# Patient Record
Sex: Female | Born: 1940 | Race: White | Hispanic: No | Marital: Married | Smoking: Never smoker
Health system: Southern US, Community
[De-identification: ages and names within clinical notes are randomized; demographics above are authoritative.]

## PROBLEM LIST (undated history)

## (undated) DIAGNOSIS — T4145XA Adverse effect of unspecified anesthetic, initial encounter: Secondary | ICD-10-CM

## (undated) DIAGNOSIS — I739 Peripheral vascular disease, unspecified: Secondary | ICD-10-CM

## (undated) DIAGNOSIS — Z9889 Other specified postprocedural states: Secondary | ICD-10-CM

## (undated) DIAGNOSIS — F419 Anxiety disorder, unspecified: Secondary | ICD-10-CM

## (undated) DIAGNOSIS — R2689 Other abnormalities of gait and mobility: Secondary | ICD-10-CM

## (undated) DIAGNOSIS — E785 Hyperlipidemia, unspecified: Secondary | ICD-10-CM

## (undated) DIAGNOSIS — K219 Gastro-esophageal reflux disease without esophagitis: Secondary | ICD-10-CM

## (undated) DIAGNOSIS — F32A Depression, unspecified: Secondary | ICD-10-CM

## (undated) DIAGNOSIS — T8859XA Other complications of anesthesia, initial encounter: Secondary | ICD-10-CM

## (undated) DIAGNOSIS — A809 Acute poliomyelitis, unspecified: Secondary | ICD-10-CM

## (undated) DIAGNOSIS — M1990 Unspecified osteoarthritis, unspecified site: Secondary | ICD-10-CM

## (undated) DIAGNOSIS — R112 Nausea with vomiting, unspecified: Secondary | ICD-10-CM

## (undated) DIAGNOSIS — R51 Headache: Secondary | ICD-10-CM

## (undated) DIAGNOSIS — F329 Major depressive disorder, single episode, unspecified: Secondary | ICD-10-CM

## (undated) DIAGNOSIS — R519 Headache, unspecified: Secondary | ICD-10-CM

## (undated) HISTORY — PX: OOPHORECTOMY: SHX86

## (undated) HISTORY — PX: EYE SURGERY: SHX253

## (undated) HISTORY — PX: TONSILLECTOMY: SUR1361

## (undated) HISTORY — DX: Acute poliomyelitis, unspecified: A80.9

## (undated) HISTORY — PX: REDUCTION MAMMAPLASTY: SUR839

## (undated) HISTORY — DX: Unspecified osteoarthritis, unspecified site: M19.90

## (undated) HISTORY — DX: Major depressive disorder, single episode, unspecified: F32.9

## (undated) HISTORY — DX: Anxiety disorder, unspecified: F41.9

## (undated) HISTORY — DX: Depression, unspecified: F32.A

## (undated) HISTORY — DX: Hyperlipidemia, unspecified: E78.5

## (undated) HISTORY — DX: Gastro-esophageal reflux disease without esophagitis: K21.9

## (undated) HISTORY — PX: BREAST CYST EXCISION: SHX579

## (undated) HISTORY — PX: BREAST SURGERY: SHX581

## (undated) HISTORY — PX: BREAST BIOPSY: SHX20

## (undated) HISTORY — DX: Other abnormalities of gait and mobility: R26.89

## (undated) HISTORY — PX: OTHER SURGICAL HISTORY: SHX169

---

## 1995-04-09 HISTORY — PX: OTHER SURGICAL HISTORY: SHX169

## 2004-04-08 HISTORY — PX: REPLACEMENT TOTAL KNEE: SUR1224

## 2012-09-11 ENCOUNTER — Encounter: Payer: Self-pay | Admitting: Gastroenterology

## 2012-10-14 ENCOUNTER — Encounter: Payer: Self-pay | Admitting: Gastroenterology

## 2012-10-14 ENCOUNTER — Ambulatory Visit (INDEPENDENT_AMBULATORY_CARE_PROVIDER_SITE_OTHER): Payer: Medicare HMO | Admitting: Gastroenterology

## 2012-10-14 VITALS — BP 136/82 | HR 80 | Ht 64.0 in | Wt 182.4 lb

## 2012-10-14 DIAGNOSIS — Z1211 Encounter for screening for malignant neoplasm of colon: Secondary | ICD-10-CM

## 2012-10-14 MED ORDER — NA SULFATE-K SULFATE-MG SULF 17.5-3.13-1.6 GM/177ML PO SOLN: 1.0000 | Freq: Once | ORAL | Status: DC

## 2012-10-14 NOTE — Progress Notes (Signed)
History of Present Illness:  Pleasant 72 year old white female referred for screening colonoscopy. Last examined 2003 apparently was unremarkable. She has no GI complaints including change of bowel habits, abdominal pain, melena or hematochezia.    Past Medical History  Diagnosis Date  . Arthritis   . Depression   . Hyperlipemia    Past Surgical History  Procedure Laterality Date  . Oophorectomy     family history includes Ovarian cancer in her mother. Current Outpatient Prescriptions  Medication Sig Dispense Refill  . omeprazole (PRILOSEC) 20 MG capsule Take 20 mg by mouth 2 (two) times daily.      . pravastatin (PRAVACHOL) 10 MG tablet Take 10 mg by mouth daily. Patient unsure of dosage       No current facility-administered medications for this visit.   Allergies as of 10/14/2012  . (No Known Allergies)    reports that she has never smoked. She has never used smokeless tobacco. She reports that  drinks alcohol. She reports that she does not use illicit drugs.     Review of Systems: Pertinent positive and negative review of systems were noted in the above HPI section. All other review of systems were otherwise negative.  Vital signs were reviewed in today's medical record Physical Exam: General: Well developed , well nourished, no acute distress Skin: anicteric Head: Normocephalic and atraumatic Eyes:  sclerae anicteric, EOMI Ears: Normal auditory acuity Mouth: No deformity or lesions Neck: Supple, no masses or thyromegaly Lungs: Clear throughout to auscultation Heart: Regular rate and rhythm; no murmurs, rubs or bruits Abdomen: Soft, non tender and non distended. No masses, hepatosplenomegaly or hernias noted. Normal Bowel sounds Rectal:deferred Musculoskeletal: Symmetrical with no gross deformities  Skin: No lesions on visible extremities Pulses:  Normal pulses noted Extremities: No clubbing, cyanosis, edema or deformities noted Neurological: Alert oriented x 4,  grossly nonfocal Cervical Nodes:  No significant cervical adenopathy Inguinal Nodes: No significant inguinal adenopathy Psychological:  Alert and cooperative. Normal mood and affect

## 2012-10-14 NOTE — Patient Instructions (Addendum)

## 2012-10-14 NOTE — Assessment & Plan Note (Signed)
Plan screening colonoscopy  Risks, alternatives, and complications of the procedure, including bleeding, perforation, and possible need for surgery, were explained to the patient.  Patient's questions were answered.  

## 2012-12-23 ENCOUNTER — Encounter: Payer: Medicare HMO | Admitting: Gastroenterology

## 2013-01-04 ENCOUNTER — Encounter: Payer: Self-pay | Admitting: Gastroenterology

## 2013-01-04 ENCOUNTER — Ambulatory Visit (AMBULATORY_SURGERY_CENTER): Payer: Medicare HMO | Admitting: Gastroenterology

## 2013-01-04 VITALS — BP 118/70 | HR 67 | Temp 97.2°F | Resp 17 | Ht 64.0 in | Wt 182.0 lb

## 2013-01-04 DIAGNOSIS — K5730 Diverticulosis of large intestine without perforation or abscess without bleeding: Secondary | ICD-10-CM

## 2013-01-04 DIAGNOSIS — Z1211 Encounter for screening for malignant neoplasm of colon: Secondary | ICD-10-CM

## 2013-01-04 DIAGNOSIS — K648 Other hemorrhoids: Secondary | ICD-10-CM

## 2013-01-04 MED ORDER — SODIUM CHLORIDE 0.9 % IV SOLN: 500.0000 mL | INTRAVENOUS | Status: DC

## 2013-01-04 NOTE — Op Note (Signed)
[facility] [address] [place], [address]   COLONOSCOPY PROCEDURE REPORT  PATIENT: Joanne, Edwards  MR#: [id number] BIRTHDATE: 02-16-1941 , 72  yrs. old GENDER: Female ENDOSCOPIST: Louis Meckel, MD REFERRED PP:IRJJOA Sun, M.D. PROCEDURE DATE:  01/04/2013 PROCEDURE:   Colonoscopy, diagnostic First Screening Colonoscopy - Avg.  risk and is 50 yrs.  old or older - No.  Prior Negative Screening - Now for repeat screening. 10 or more years since last screening  History of Adenoma - Now for follow-up colonoscopy & has been > or = to 3 yrs.  N/A  Polyps Removed Today? No.  Recommend repeat exam, <10 yrs? No. ASA CLASS:   Class II INDICATIONS:Colorectal cancer screening. MEDICATIONS: MAC sedation, administered by CRNA and propofol (Diprivan) 250mg  IV  DESCRIPTION OF PROCEDURE:   After the risks benefits and alternatives of the procedure were thoroughly explained, informed consent was obtained.  A digital rectal exam revealed no abnormalities of the rectum.   The LB [id number]  endoscope was introduced through the anus and advanced to the cecum, which was identified by both the appendix and ileocecal valve. No adverse events experienced.   The quality of the prep was excellent using Suprep  The instrument was then slowly withdrawn as the colon was fully examined.      COLON FINDINGS: Mild diverticulosis was noted in the sigmoid colon. Internal hemorrhoids were found.   The colon mucosa was otherwise normal.  Retroflexed views revealed no abnormalities. The time to cecum=2 minutes 30 seconds.  Withdrawal time=7 minutes 36 seconds. The scope was withdrawn and the procedure completed. COMPLICATIONS: There were no complications.  ENDOSCOPIC IMPRESSION: 1.   Mild diverticulosis was noted in the sigmoid colon 2.   Internal hemorrhoids 3.   The colon mucosa was otherwise normal  RECOMMENDATIONS: Continue current colorectal screening recommendations for  "routine risk" patients with a repeat colonoscopy in 10 years.   eSigned:  Louis Meckel, MD 01/04/2013 9:51 AM   cc:   PATIENT NAME:  Joanne, Edwards MR#: [id number]

## 2013-01-04 NOTE — Progress Notes (Signed)
Patient did not experience any of the following events: a burn prior to discharge; a fall within the facility; wrong site/side/patient/procedure/implant event; or a hospital transfer or hospital admission upon discharge from the facility. (G8907) Patient did not have preoperative order for IV antibiotic SSI prophylaxis. (G8918)  

## 2013-01-04 NOTE — Patient Instructions (Addendum)
YOU HAD AN ENDOSCOPIC PROCEDURE TODAY AT THE [facility]: Refer to the procedure report that was given to you for any specific questions about what was found during the examination.  If the procedure report does not answer your questions, please call your gastroenterologist to clarify.  If you requested that your care partner not be given the details of your procedure findings, then the procedure report has been included in a sealed envelope for you to review at your convenience later.  YOU SHOULD EXPECT: Some feelings of bloating in the abdomen. Passage of more gas than usual.  Walking can help get rid of the air that was put into your GI tract during the procedure and reduce the bloating. If you had a lower endoscopy (such as a colonoscopy or flexible sigmoidoscopy) you may notice spotting of blood in your stool or on the toilet paper. If you underwent a bowel prep for your procedure, then you may not have a normal bowel movement for a few days.  DIET: Your first meal following the procedure should be a light meal and then it is ok to progress to your normal diet.  A half-sandwich or bowl of soup is an example of a good first meal.  Heavy or fried foods are harder to digest and may make you feel nauseous or bloated.  Likewise meals heavy in dairy and vegetables can cause extra gas to form and this can also increase the bloating.  Drink plenty of fluids but you should avoid alcoholic beverages for 24 hours.  ACTIVITY: Your care partner should take you home directly after the procedure.  You should plan to take it easy, moving slowly for the rest of the day.  You can resume normal activity the day after the procedure however you should NOT DRIVE or use heavy machinery for 24 hours (because of the sedation medicines used during the test).    SYMPTOMS TO REPORT IMMEDIATELY: A gastroenterologist can be reached at any hour.  During normal business hours, 8:30 AM to 5:00 PM Monday through Friday,  call [phone].  After hours and on weekends, please call the GI answering service at [phone] who will take a message and have the physician on call contact you.   Following lower endoscopy (colonoscopy or flexible sigmoidoscopy):  Excessive amounts of blood in the stool  Significant tenderness or worsening of abdominal pains  Swelling of the abdomen that is new, acute  Fever of 100F or higher   FOLLOW UP: If any biopsies were taken you will be contacted by phone or by letter within the next 1-3 weeks.  Call your gastroenterologist if you have not heard about the biopsies in 3 weeks.  Our staff will call the home number listed on your records the next business day following your procedure to check on you and address any questions or concerns that you may have at that time regarding the information given to you following your procedure. This is a courtesy call and so if there is no answer at the home number and we have not heard from you through the emergency physician on call, we will assume that you have returned to your regular daily activities without incident.  SIGNATURES/CONFIDENTIALITY: You and/or your care partner have signed paperwork which will be entered into your electronic medical record.  These signatures attest to the fact that that the information above on your After Visit Summary has been reviewed and is understood.  Full responsibility of the confidentiality of   this discharge information lies with you and/or your care-partner.   Resume medications.Information given on diverticulosis,hemorrhoids and high fiber diet with discharge instructions. 

## 2013-01-05 ENCOUNTER — Telehealth: Payer: Self-pay | Admitting: *Deleted

## 2013-01-05 NOTE — Telephone Encounter (Signed)
  Follow up Call-  Call back number 01/04/2013  Post procedure Call Back phone  # [phone]  Permission to leave phone message Yes     Patient questions:  Do you have a fever, pain , or abdominal swelling? no Pain Score  0 *  Have you tolerated food without any problems? yes  Have you been able to return to your normal activities? yes  Do you have any questions about your discharge instructions: Diet   no Medications  no Follow up visit  no  Do you have questions or concerns about your Care? no  Actions: * If pain score is 4 or above: No action needed, pain <4.

## 2013-02-03 ENCOUNTER — Other Ambulatory Visit: Payer: Self-pay | Admitting: Vascular Surgery

## 2013-02-03 DIAGNOSIS — I83893 Varicose veins of bilateral lower extremities with other complications: Secondary | ICD-10-CM

## 2013-02-06 LAB — HM MAMMOGRAPHY

## 2013-02-17 ENCOUNTER — Encounter: Payer: Self-pay | Admitting: Vascular Surgery

## 2013-02-18 ENCOUNTER — Ambulatory Visit (HOSPITAL_COMMUNITY)
Admission: RE | Admit: 2013-02-18 | Discharge: 2013-02-18 | Disposition: A | Discharge status: Home / Self Care | Payer: Medicare HMO | Source: Ambulatory Visit | Attending: Vascular Surgery | Admitting: Vascular Surgery

## 2013-02-18 ENCOUNTER — Ambulatory Visit (INDEPENDENT_AMBULATORY_CARE_PROVIDER_SITE_OTHER): Payer: Medicare HMO | Admitting: Vascular Surgery

## 2013-02-18 ENCOUNTER — Encounter: Payer: Self-pay | Admitting: Vascular Surgery

## 2013-02-18 VITALS — BP 143/82 | HR 73 | Ht 64.0 in | Wt 189.3 lb

## 2013-02-18 DIAGNOSIS — I83893 Varicose veins of bilateral lower extremities with other complications: Secondary | ICD-10-CM | POA: Insufficient documentation

## 2013-02-18 NOTE — Progress Notes (Signed)
[facility] [place] HISTORY AND PHYSICAL   History of Present Illness:  Patient is a 72 y.o. year old female who presents for evaluation of varicose veins left leg. The patient complains of a cluster of varicose veins in her left ankle area. She states is become swollen painful and itchy when she is on her feet all day. This is especially worse in the summer time. She has some occasional pain in the outer aspect of her right leg but this is not really bothersome to her. She has worn compression stockings in the remote past. 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 She has not worn these recently. She has had no prior interventions on her legs except for a right knee replacement. 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 Other medical problems include arthritis depression and hyperlipidemia all of which are currently stable    Past Medical History  Diagnosis Date  . Arthritis   . Depression   . Hyperlipemia     Past Surgical History  Procedure Laterality Date  . Oophorectomy    . Joint replacement Right 2006    knee  . Tummy tuck    . Breast surgery      reduction  . Tonsillectomy    . Eye surgery      "fat removed from under R eye"    Social History History  Substance Use Topics  . Smoking status: Never Smoker   . Smokeless tobacco: Never Used  . Alcohol Use: Yes     Comment: occ   she is a retired Engineer, civil (consulting)  Family History Family History  Problem Relation Age of Onset  . Ovarian cancer Mother   . Colon cancer Neg Hx   . Esophageal cancer Neg Hx   . Rectal cancer Neg Hx   . Stomach cancer Neg Hx     Allergies  No Known Allergies   Current Outpatient Prescriptions  Medication Sig Dispense Refill  . clonazePAM (KLONOPIN) 0.5 MG tablet Take 0.5 mg by mouth as needed.      Marland Kitchen imipramine (TOFRANIL) 10 MG  tablet Take 10 mg by mouth at bedtime.      Marland Kitchen omeprazole (PRILOSEC) 20 MG capsule Take 20 mg by mouth 2 (two) times daily.      . pravastatin (PRAVACHOL) 40 MG tablet Take 1 tablet by mouth daily.      . sertraline (ZOLOFT) 100 MG tablet Take 100 mg by mouth daily.       No current facility-administered medications for this visit.    ROS:   General:  No weight loss, Fever, chills  HEENT: No recent headaches, no nasal bleeding, no visual changes, no sore throat  Neurologic: No dizziness, blackouts, seizures. No recent symptoms of stroke or mini- stroke. No recent episodes of slurred speech, or temporary blindness.  Cardiac: No recent episodes of chest pain/pressure, no shortness of breath at rest.  No shortness of breath with exertion.  Denies history of atrial fibrillation or irregular heartbeat  Vascular: No history of rest pain in feet.  No history of claudication.  No history of non-healing ulcer, No history of DVT   Pulmonary: No home oxygen, no productive cough, no hemoptysis,  No asthma or wheezing  Musculoskeletal:  [x ] Arthritis, [ ]  Low back pain,  [ ]   Joint pain  Hematologic:No history of hypercoagulable state.  No history of easy bleeding.  No history of anemia  Gastrointestinal: No hematochezia or melena,  No gastroesophageal reflux, no trouble swallowing  Urinary: [ ]  chronic Kidney disease, [ ]  on HD - [ ]  MWF or [ ]  TTHS, [ ]  Burning with urination, [ ]  Frequent urination, [ ]  Difficulty urinating;   Skin: No rashes  Psychological: No history of anxiety,  No history of depression   Physical Examination  Filed Vitals:   02/18/13 1022  BP: 143/82  Pulse: 73  Height: 5\' 4"  (1.626 m)  Weight: 189 lb 4.8 oz (85.866 kg)  SpO2: 99%    Body mass index is 32.48 kg/(m^2).  General:  Alert and oriented, no acute distress HEENT: Normal Neck: No bruit or JVD Pulmonary: Clear to auscultation bilaterally Cardiac: Regular Rate and Rhythm without murmur Abdomen:  Soft, non-tender, non-distended, no mass Skin: No rash, 5 cm cluster of varicosities left sort of dorsal medial ankle area primarily 2 mm in diameter no open ulcer, scattered reticular type varicosities right lateral thigh and calf, small clusters of varicose veins scattered throughout the right leg Extremity Pulses:  2+ radial, brachial, femoral, dorsalis pedis, posterior tibial pulses bilaterally Musculoskeletal: No deformity or edema  Neurologic: Upper and lower extremity motor 5/5 and symmetric  DATA:  The patient had a venous reflux exam today. Left leg showed no evidence of superficial or deep venous reflux. In the right leg she had reflux in the right common femoral and popliteal vein. Greater saphenous vein also had reflux and was 4-7 mm in diameter. I reviewed and interpreted this study.   ASSESSMENT:  Symptomatic varicose veins left ankle area. She has no evidence of superficial or deep venous reflux in the left. Right leg has evidence of superficial and deep venous reflux but is less symptomatic.   PLAN:  Bilateral compression stockings lower extremities. Patient given prescription today. We will have her evaluated by our veins in her nurse for possible sclerotherapy if symptomatic varicose veins in the left leg. She will followup in the future she wishes to consider laser ablation of her right greater saphenous vein.  [name], MD [facility] [place] Office: [phone] Pager: [phone]

## 2013-03-17 ENCOUNTER — Ambulatory Visit: Payer: Medicare HMO | Admitting: *Deleted

## 2013-04-27 ENCOUNTER — Encounter: Payer: Self-pay | Admitting: *Deleted

## 2013-04-28 ENCOUNTER — Ambulatory Visit: Payer: Medicare HMO | Admitting: *Deleted

## 2013-06-29 ENCOUNTER — Telehealth: Payer: Self-pay

## 2013-06-29 NOTE — Telephone Encounter (Signed)
Medication and allergies:  Reviewed and updated  90 day supply/mail order:  [place], [address] [facility] [address] Local pharmacy:  [id number] [place], [place] [address] AT [address]   Immunizations due:  UTD   A/P: No changes to personal, family history or past surgical hx PAP-no longer receive CCS- 01/04/13-Dr. Deatra Ina- mild diverticulosis and internal hemorrhoids, repeat in 10 yrs MMG- 02/2013-normal-per patient BD- DUE per patient; had one 1-2 years ago-normal per patient Flu- 12/2012-per patient Tdap- less than 10 years ago per patient  PNA- 12/2007 per patient Shingles- 12/2007 per patient    To Discuss with Provider: Nothing at this time.

## 2013-07-02 ENCOUNTER — Encounter: Payer: Self-pay | Admitting: Family Medicine

## 2013-07-02 ENCOUNTER — Encounter: Payer: Self-pay | Admitting: General Practice

## 2013-07-02 ENCOUNTER — Ambulatory Visit (INDEPENDENT_AMBULATORY_CARE_PROVIDER_SITE_OTHER): Payer: Medicare HMO | Admitting: Family Medicine

## 2013-07-02 VITALS — BP 126/80 | HR 88 | Temp 98.2°F | Resp 16 | Ht 64.0 in | Wt 186.4 lb

## 2013-07-02 DIAGNOSIS — E785 Hyperlipidemia, unspecified: Secondary | ICD-10-CM | POA: Insufficient documentation

## 2013-07-02 DIAGNOSIS — F418 Other specified anxiety disorders: Secondary | ICD-10-CM | POA: Insufficient documentation

## 2013-07-02 DIAGNOSIS — F341 Dysthymic disorder: Secondary | ICD-10-CM

## 2013-07-02 DIAGNOSIS — K219 Gastro-esophageal reflux disease without esophagitis: Secondary | ICD-10-CM

## 2013-07-02 LAB — TSH: TSH: 1.65 u[IU]/mL (ref 0.35–5.50)

## 2013-07-02 LAB — LIPID PANEL
CHOLESTEROL: 190 mg/dL (ref 0–200)
HDL: 56.8 mg/dL (ref >39.00)
LDL Cholesterol: 110 mg/dL — ABNORMAL HIGH (ref 0–99)
TRIGLYCERIDES: 114 mg/dL (ref 0.0–149.0)
Total CHOL/HDL Ratio: 3
VLDL: 22.8 mg/dL (ref 0.0–40.0)

## 2013-07-02 LAB — HEPATIC FUNCTION PANEL
ALBUMIN: 4.1 g/dL (ref 3.5–5.2)
ALT: 32 U/L (ref 0–35)
AST: 26 U/L (ref 0–37)
Alkaline Phosphatase: 67 U/L (ref 39–117)
BILIRUBIN TOTAL: 0.2 mg/dL — AB (ref 0.3–1.2)
Bilirubin, Direct: 0 mg/dL (ref 0.0–0.3)
TOTAL PROTEIN: 7.3 g/dL (ref 6.0–8.3)

## 2013-07-02 LAB — BASIC METABOLIC PANEL
BUN: 11 mg/dL (ref 6–23)
CHLORIDE: 101 meq/L (ref 96–112)
CO2: 29 meq/L (ref 19–32)
CREATININE: 0.8 mg/dL (ref 0.4–1.2)
Calcium: 9.4 mg/dL (ref 8.4–10.5)
GFR: 77.49 mL/min (ref >60.00)
GLUCOSE: 100 mg/dL — AB (ref 70–99)
Potassium: 3.8 mEq/L (ref 3.5–5.1)
Sodium: 139 mEq/L (ref 135–145)

## 2013-07-02 NOTE — Assessment & Plan Note (Signed)
New to provider, chronic for pt.  Tolerating statin w/o difficulty.  Check labs.  Adjust meds prn

## 2013-07-02 NOTE — Assessment & Plan Note (Signed)
New to provider, ongoing for pt.  Adequate control of sxs w/ omeprazole.  No changes at this time.  Reviewed lifestyle and dietary modifications.

## 2013-07-02 NOTE — Patient Instructions (Signed)
Schedule your complete physical in 6 months We'll notify you of your lab results and make any changes if needed Keep up the good work!  You look great! Call with any questions or concerns Welcome!  We're glad to have you!!! 

## 2013-07-02 NOTE — Assessment & Plan Note (Signed)
New to provider, ongoing for pt.  Doing well on Zoloft.  Rarely requiring Klonopin.  Has been in counseling and recently found support group.  No med changes, will follow.

## 2013-07-02 NOTE — Progress Notes (Signed)
Pre visit review using our clinic review tool, if applicable. No additional management support is needed unless otherwise documented below in the visit note. 

## 2013-07-02 NOTE — Progress Notes (Signed)
   Subjective:    Patient ID: Joanne Edwards, female    DOB: 09/30/1940, 73 y.o.   MRN: [id number]  HPI New to establish.  Previous MD- [place]  GERD- pt reports hx of silent GERD.  Currently on Omeprazole w/ good control of sxs.  sxs worsen based on food choices- particularly spicy food.  Hyperlipidemia- chronic problem, on Pravastatin.  Denies abd pain, N/V, myalgias.  Depression/anxiety- chronic problem, on Zoloft and Clonazepam.  Oldest son died at age 110 due to aneurysm (2 yrs ago).  Feels sxs are well controlled but will have intermittent anxiety.  Also on melatonin.  Has done counseling.   Review of Systems For ROS see HPI     Objective:   Physical Exam  Vitals reviewed. Constitutional: She is oriented to person, place, and time. She appears well-developed and well-nourished. No distress.  HENT:  Head: Normocephalic and atraumatic.  Eyes: Conjunctivae and EOM are normal. Pupils are equal, round, and reactive to light.  Neck: Normal range of motion. Neck supple. No thyromegaly present.  Cardiovascular: Normal rate, regular rhythm, normal heart sounds and intact distal pulses.   No murmur heard. Pulmonary/Chest: Effort normal and breath sounds normal. No respiratory distress.  Abdominal: Soft. She exhibits no distension. There is no tenderness.  Musculoskeletal: She exhibits no edema.  Lymphadenopathy:    She has no cervical adenopathy.  Neurological: She is alert and oriented to person, place, and time.  Skin: Skin is warm and dry.  Psychiatric: She has a normal mood and affect. Her behavior is normal.          Assessment & Plan:

## 2013-07-30 ENCOUNTER — Telehealth: Payer: Self-pay | Admitting: Family Medicine

## 2013-07-30 MED ORDER — SERTRALINE HCL 100 MG PO TABS: 100.0000 mg | ORAL_TABLET | Freq: Every day | ORAL | Status: DC

## 2013-07-30 NOTE — Telephone Encounter (Signed)
Caller name:Makaylynn Largent Relation to JI:RCVELFY Call back [phone] [facility]acy:[facility] [address]   Reason for call: Patient called and requested a refill for Zoloft.

## 2013-07-30 NOTE — Telephone Encounter (Signed)
Med filled.  

## 2013-09-10 ENCOUNTER — Telehealth: Payer: Self-pay | Admitting: Family Medicine

## 2013-09-10 MED ORDER — SERTRALINE HCL 100 MG PO TABS: 100.0000 mg | ORAL_TABLET | Freq: Every day | ORAL | Status: DC

## 2013-09-10 MED ORDER — PRAVASTATIN SODIUM 40 MG PO TABS: 40.0000 mg | ORAL_TABLET | Freq: Every day | ORAL | Status: DC

## 2013-09-10 NOTE — Telephone Encounter (Signed)
Caller name: Antoria, Lanza Relation to OV:ZCHYIFO Call back number: [phone] Pharmacy: [facility]   Reason for call: patient called to request a refill for Sertaline and Pravastatin

## 2013-09-10 NOTE — Telephone Encounter (Signed)
Med filled.  

## 2013-09-16 ENCOUNTER — Other Ambulatory Visit: Payer: Self-pay | Admitting: General Practice

## 2013-09-16 MED ORDER — SERTRALINE HCL 100 MG PO TABS: ORAL_TABLET | ORAL | Status: DC

## 2013-09-24 ENCOUNTER — Telehealth: Payer: Self-pay | Admitting: Family Medicine

## 2013-09-24 NOTE — Telephone Encounter (Signed)
Is this something that you would like to do? Does she need an appt for evaluating this?  Please advise.

## 2013-09-24 NOTE — Telephone Encounter (Signed)
Caller name: Romonda Phone: [phone]   Reason for call:   Pt moved to new home, and is needing a letter or referral so she can get a chair lift for the basement at a cheaper rate.

## 2013-09-27 ENCOUNTER — Encounter: Payer: Self-pay | Admitting: Family Medicine

## 2013-09-27 NOTE — Telephone Encounter (Signed)
Pt needs appt prior to referral or letter since I don't currently have a problem on her problem list that would warrant a lift

## 2013-09-27 NOTE — Telephone Encounter (Signed)
Chart reviewed and updated.    Pt states that she is currently living in a two story house with an upstairs and downstairs, and she has difficulty going up the steps and balance issues going down the steps.  Therefore a chair lift is needed.

## 2013-09-27 NOTE — Telephone Encounter (Signed)
Pt called, advised that she has had polio in the past, does not have part of her leg muscle and had a knee replacement in 2006 just to name a few things. Could you call her and pre-visit call. Pt is scheduled for tomorrow morning at 11am.

## 2013-09-27 NOTE — Telephone Encounter (Signed)
Pt states she can also be reached on her cell at [phone]

## 2013-09-28 ENCOUNTER — Encounter: Payer: Self-pay | Admitting: Family Medicine

## 2013-09-28 ENCOUNTER — Ambulatory Visit (INDEPENDENT_AMBULATORY_CARE_PROVIDER_SITE_OTHER): Payer: Medicare HMO | Admitting: Family Medicine

## 2013-09-28 VITALS — BP 118/80 | HR 80 | Temp 98.1°F | Resp 16 | Wt 188.5 lb

## 2013-09-28 DIAGNOSIS — R29898 Other symptoms and signs involving the musculoskeletal system: Secondary | ICD-10-CM

## 2013-09-28 NOTE — Patient Instructions (Signed)
Follow up as scheduled Call with any questions or concerns (including if they send you paperwork for completion) Have a great summer!!!

## 2013-09-28 NOTE — Progress Notes (Signed)
Pre visit review using our clinic review tool, if applicable. No additional management support is needed unless otherwise documented below in the visit note. 

## 2013-09-28 NOTE — Progress Notes (Signed)
   Subjective:    Patient ID: Joanne Edwards, female    DOB: 06/24/1940, 73 y.o.   MRN: [id number]  HPI Need for stair lift- pt had lift in her previous house and now that she has moved she needs another to get to her basement to do her laundry.  Pt had polio at age 73 and has residual muscle weakness and atrophy in L leg.  Has had to have knee replacement in 'good leg' and now has difficulty w/ stairs and balance.  For safety reasons, requires assistance in form of lift or elevator on stairs.   Review of Systems For ROS see HPI     Objective:   Physical Exam  Vitals reviewed. Constitutional: She is oriented to person, place, and time. She appears well-developed and well-nourished. No distress.  Cardiovascular: Intact distal pulses.   Musculoskeletal: She exhibits no edema.  Neurological: She is alert and oriented to person, place, and time. Coordination normal.  Absent patellar reflexes on L L lower leg weakness and calf atrophy          Assessment & Plan:

## 2013-09-28 NOTE — Assessment & Plan Note (Signed)
New to provider, ongoing for pt due to residual effects of polio at age 73.  Letter completed for pt to get stair lift in her new home in order for her to safely access the basement to do her laundry.

## 2013-12-06 ENCOUNTER — Telehealth: Payer: Self-pay | Admitting: Family Medicine

## 2013-12-06 MED ORDER — PRAVASTATIN SODIUM 40 MG PO TABS: 40.0000 mg | ORAL_TABLET | Freq: Every day | ORAL | Status: DC

## 2013-12-06 MED ORDER — SERTRALINE HCL 100 MG PO TABS: ORAL_TABLET | ORAL | Status: DC

## 2013-12-06 NOTE — Telephone Encounter (Signed)
Requesting refills on pravastation 40mg  and sertraline 100mg , [facility] [address]

## 2013-12-06 NOTE — Telephone Encounter (Signed)
Medications filled.  

## 2014-01-05 ENCOUNTER — Encounter: Payer: Medicare HMO | Admitting: Family Medicine

## 2014-01-06 ENCOUNTER — Encounter: Payer: Self-pay | Admitting: Family Medicine

## 2014-01-06 ENCOUNTER — Ambulatory Visit (INDEPENDENT_AMBULATORY_CARE_PROVIDER_SITE_OTHER): Payer: Commercial Managed Care - HMO | Admitting: Family Medicine

## 2014-01-06 VITALS — BP 120/78 | HR 73 | Temp 97.9°F | Resp 16 | Ht 64.25 in | Wt 192.2 lb

## 2014-01-06 DIAGNOSIS — E785 Hyperlipidemia, unspecified: Secondary | ICD-10-CM

## 2014-01-06 DIAGNOSIS — F418 Other specified anxiety disorders: Secondary | ICD-10-CM

## 2014-01-06 DIAGNOSIS — Z0000 Encounter for general adult medical examination without abnormal findings: Secondary | ICD-10-CM | POA: Insufficient documentation

## 2014-01-06 DIAGNOSIS — Z780 Asymptomatic menopausal state: Secondary | ICD-10-CM

## 2014-01-06 NOTE — Progress Notes (Signed)
Pre visit review using our clinic review tool, if applicable. No additional management support is needed unless otherwise documented below in the visit note. 

## 2014-01-06 NOTE — Progress Notes (Signed)
   Subjective:    Patient ID: Joanne Edwards, female    DOB: January 22, 1941, 73 y.o.   MRN: [id number]  HPI Here today for CPE.  Risk Factors: Hyperlipidemia- chronic problem, on Pravastatin.  Denies abd pain, N/V, myalgias. Physical Activity: regular exercise, 45 minutes 3-4x/week Fall Risk: low risk Depression: denies current sxs, currently on Zoloft Hearing: ADL's: independent Cognitive: normal linear thought process, memory and attention intact Home Safety: safe at home Height, Weight, BMI, Visual Acuity: see vitals, vision corrected to 20/20 w/ glasses Counseling: UTD on colonoscopy, mammo, no need for paps,  Due for DEXA ([facility]) Labs Ordered: See A&P Care Plan: See A&P    Review of Systems Patient reports no vision/ hearing changes, adenopathy,fever, weight change,  persistant/recurrent hoarseness , swallowing issues, chest pain, palpitations, edema, persistant/recurrent cough, hemoptysis, dyspnea (rest/exertional/paroxysmal nocturnal), gastrointestinal bleeding (melena, rectal bleeding), abdominal pain, significant heartburn, bowel changes, GU symptoms (dysuria, hematuria, incontinence), Gyn symptoms (abnormal  bleeding, pain),  syncope, focal weakness, memory loss, numbness & tingling, skin/hair/nail changes, abnormal bruising or bleeding, anxiety, or depression.     Objective:   Physical Exam General Appearance:    Alert, cooperative, no distress, appears stated age  Head:    Normocephalic, without obvious abnormality, atraumatic  Eyes:    PERRL, conjunctiva/corneas clear, EOM's intact, fundi    benign, both eyes  Ears:    Normal TM's and external ear canals, both ears  Nose:   Nares normal, septum midline, mucosa normal, no drainage    or sinus tenderness  Throat:   Lips, mucosa, and tongue normal; teeth and gums normal  Neck:   Supple, symmetrical, trachea midline, no adenopathy;    Thyroid: no enlargement/tenderness/nodules  Back:     Symmetric, no curvature, ROM  normal, no CVA tenderness  Lungs:     Clear to auscultation bilaterally, respirations unlabored  Chest Wall:    No tenderness or deformity   Heart:    Regular rate and rhythm, S1 and S2 normal, no murmur, rub   or gallop  Breast Exam:    Deferred to mammo  Abdomen:     Soft, non-tender, bowel sounds active all four quadrants,    no masses, no organomegaly  Genitalia:    Deferred at pt's request  Rectal:    Extremities:   Extremities normal, atraumatic, no cyanosis or edema  Pulses:   2+ and symmetric all extremities  Skin:   Skin color, texture, turgor normal, no rashes or lesions  Lymph nodes:   Cervical, supraclavicular, and axillary nodes normal  Neurologic:   CNII-XII intact, normal strength, sensation and reflexes    throughout          Assessment & Plan:

## 2014-01-06 NOTE — Patient Instructions (Signed)
Follow up in 6 months to recheck cholesterol We'll notify you of your lab results and make any changes if needed We'll call you with your bone density appt Keep up the good work!  You look great! Happy Fall!!!

## 2014-01-07 LAB — CBC WITH DIFFERENTIAL/PLATELET
Basophils Absolute: 0 10*3/uL (ref 0.0–0.1)
Basophils Relative: 0.5 % (ref 0.0–3.0)
EOS ABS: 0.1 10*3/uL (ref 0.0–0.7)
EOS PCT: 1.7 % (ref 0.0–5.0)
HCT: 36.8 % (ref 36.0–46.0)
Hemoglobin: 12.3 g/dL (ref 12.0–15.0)
LYMPHS PCT: 31.8 % (ref 12.0–46.0)
Lymphs Abs: 1.7 10*3/uL (ref 0.7–4.0)
MCHC: 33.4 g/dL (ref 30.0–36.0)
MCV: 92.1 fl (ref 78.0–100.0)
Monocytes Absolute: 0.5 10*3/uL (ref 0.1–1.0)
Monocytes Relative: 8.8 % (ref 3.0–12.0)
NEUTROS PCT: 57.2 % (ref 43.0–77.0)
Neutro Abs: 3 10*3/uL (ref 1.4–7.7)
Platelets: 239 10*3/uL (ref 150.0–400.0)
RBC: 3.99 Mil/uL (ref 3.87–5.11)
RDW: 13.2 % (ref 11.5–15.5)
WBC: 5.2 10*3/uL (ref 4.0–10.5)

## 2014-01-07 LAB — BASIC METABOLIC PANEL
BUN: 23 mg/dL (ref 6–23)
CHLORIDE: 103 meq/L (ref 96–112)
CO2: 28 meq/L (ref 19–32)
Calcium: 9.3 mg/dL (ref 8.4–10.5)
Creatinine, Ser: 0.7 mg/dL (ref 0.4–1.2)
GFR: 87.66 mL/min (ref >60.00)
GLUCOSE: 69 mg/dL — AB (ref 70–99)
Potassium: 4.1 mEq/L (ref 3.5–5.1)
Sodium: 139 mEq/L (ref 135–145)

## 2014-01-07 LAB — LIPID PANEL
Cholesterol: 255 mg/dL — ABNORMAL HIGH (ref 0–200)
HDL: 60.4 mg/dL (ref >39.00)
NonHDL: 194.6
TRIGLYCERIDES: 221 mg/dL — AB (ref 0.0–149.0)
Total CHOL/HDL Ratio: 4
VLDL: 44.2 mg/dL — ABNORMAL HIGH (ref 0.0–40.0)

## 2014-01-07 LAB — HEPATIC FUNCTION PANEL
ALBUMIN: 4.3 g/dL (ref 3.5–5.2)
ALT: 23 U/L (ref 0–35)
AST: 21 U/L (ref 0–37)
Alkaline Phosphatase: 56 U/L (ref 39–117)
Bilirubin, Direct: 0 mg/dL (ref 0.0–0.3)
TOTAL PROTEIN: 7.7 g/dL (ref 6.0–8.3)
Total Bilirubin: 0.4 mg/dL (ref 0.2–1.2)

## 2014-01-07 LAB — LDL CHOLESTEROL, DIRECT: Direct LDL: 159.4 mg/dL

## 2014-01-07 LAB — TSH: TSH: 3.22 u[IU]/mL (ref 0.35–4.50)

## 2014-01-10 NOTE — Assessment & Plan Note (Signed)
Chronic problem.  Tolerating statin w/o difficulty.  Check labs.  Adjust meds prn  

## 2014-01-10 NOTE — Assessment & Plan Note (Signed)
Pt's PE WNL.  UTD on mammo and colonoscopy.  No need for paps.  Referral for DEXA entered.  Written screening schedule updated and given to pt.  Check labs.  Anticipatory guidance provided.

## 2014-01-10 NOTE — Assessment & Plan Note (Signed)
Ongoing issue for pt.  Currently well controlled.  No changes at this time.  Will continue to follow.

## 2014-02-02 ENCOUNTER — Encounter: Payer: Self-pay | Admitting: General Practice

## 2014-02-02 ENCOUNTER — Encounter: Payer: Self-pay | Admitting: Family Medicine

## 2014-02-02 MED ORDER — SERTRALINE HCL 100 MG PO TABS: ORAL_TABLET | ORAL | Status: DC

## 2014-02-02 MED ORDER — PRAVASTATIN SODIUM 40 MG PO TABS
40.0000 mg | ORAL_TABLET | Freq: Every day | ORAL | Status: DC
Start: 2014-02-02 — End: 2014-07-08

## 2014-02-02 NOTE — Telephone Encounter (Signed)
Med filled.  

## 2014-02-04 ENCOUNTER — Other Ambulatory Visit: Payer: Self-pay | Admitting: General Practice

## 2014-02-04 ENCOUNTER — Telehealth: Payer: Self-pay | Admitting: General Practice

## 2014-02-04 MED ORDER — CLONAZEPAM 0.5 MG PO TABS: 0.5000 mg | ORAL_TABLET | ORAL | Status: DC | PRN

## 2014-02-04 MED ORDER — OMEPRAZOLE 20 MG PO CPDR: 20.0000 mg | DELAYED_RELEASE_CAPSULE | Freq: Two times a day (BID) | ORAL | Status: DC

## 2014-02-04 NOTE — Telephone Encounter (Signed)
Caller name:Cyrena  Call back number: [phone] Pharmacy: [facility]  Reason for call:  [facility] is needing the MD office to call them. -[phone]   omeprazole (PRILOSEC) 20 MG capsule  pravastatin (PRAVACHOL) 40 MG tablet  sertraline (ZOLOFT) 100 MG tablet  clonazePAM (KLONOPIN) 0.5 MG tablet

## 2014-02-04 NOTE — Addendum Note (Signed)
Addended by: Katherine Roan L on: 02/04/2014 10:15 AM   Modules accepted: Orders

## 2014-02-04 NOTE — Telephone Encounter (Signed)
Ok for #90, 1 refill 

## 2014-02-04 NOTE — Telephone Encounter (Signed)
[facility] pharmacy is requesting a 90 day refill on this prescription. Our records show that this is a historical med last filled by Dr. Kelby Fam office.

## 2014-02-04 NOTE — Telephone Encounter (Signed)
Spoke with [facility] and got all of pt's meds filled. There was a discrepancy on their end.

## 2014-02-04 NOTE — Telephone Encounter (Signed)
Med filled and faxed.  

## 2014-03-28 ENCOUNTER — Encounter: Payer: Self-pay | Admitting: Family Medicine

## 2014-04-19 ENCOUNTER — Telehealth: Payer: Self-pay | Admitting: Family Medicine

## 2014-04-19 DIAGNOSIS — L989 Disorder of the skin and subcutaneous tissue, unspecified: Secondary | ICD-10-CM

## 2014-04-19 NOTE — Telephone Encounter (Signed)
Pt requesting referral to dermatologist for skin problem under chin- curious if needing to be seen before referral

## 2014-04-20 NOTE — Telephone Encounter (Signed)
Referral placed.

## 2014-04-20 NOTE — Telephone Encounter (Signed)
If pt has skin concerns, can refer to derm

## 2014-04-21 DIAGNOSIS — Z13820 Encounter for screening for osteoporosis: Secondary | ICD-10-CM | POA: Diagnosis not present

## 2014-04-21 DIAGNOSIS — Z1231 Encounter for screening mammogram for malignant neoplasm of breast: Secondary | ICD-10-CM | POA: Diagnosis not present

## 2014-04-21 LAB — HM DEXA SCAN: HM DEXA SCAN: NORMAL

## 2014-04-21 LAB — HM MAMMOGRAPHY

## 2014-04-23 ENCOUNTER — Encounter: Payer: Self-pay | Admitting: Family Medicine

## 2014-04-25 ENCOUNTER — Encounter: Payer: Self-pay | Admitting: *Deleted

## 2014-05-17 ENCOUNTER — Encounter: Payer: Self-pay | Admitting: Family Medicine

## 2014-05-17 ENCOUNTER — Ambulatory Visit (INDEPENDENT_AMBULATORY_CARE_PROVIDER_SITE_OTHER): Payer: Commercial Managed Care - HMO | Admitting: Family Medicine

## 2014-05-17 ENCOUNTER — Ambulatory Visit (HOSPITAL_BASED_OUTPATIENT_CLINIC_OR_DEPARTMENT_OTHER)
Admission: RE | Admit: 2014-05-17 | Discharge: 2014-05-17 | Disposition: A | Discharge status: Home / Self Care | Payer: Commercial Managed Care - HMO | Source: Ambulatory Visit | Attending: Family Medicine | Admitting: Family Medicine

## 2014-05-17 VITALS — BP 142/82 | HR 87 | Temp 98.0°F | Resp 16 | Wt 193.1 lb

## 2014-05-17 DIAGNOSIS — M79662 Pain in left lower leg: Secondary | ICD-10-CM

## 2014-05-17 DIAGNOSIS — M7989 Other specified soft tissue disorders: Secondary | ICD-10-CM | POA: Diagnosis not present

## 2014-05-17 DIAGNOSIS — R600 Localized edema: Secondary | ICD-10-CM | POA: Diagnosis not present

## 2014-05-17 DIAGNOSIS — H6981 Other specified disorders of Eustachian tube, right ear: Secondary | ICD-10-CM | POA: Diagnosis not present

## 2014-05-17 DIAGNOSIS — H6991 Unspecified Eustachian tube disorder, right ear: Secondary | ICD-10-CM | POA: Insufficient documentation

## 2014-05-17 DIAGNOSIS — M79605 Pain in left leg: Secondary | ICD-10-CM | POA: Diagnosis not present

## 2014-05-17 DIAGNOSIS — M79606 Pain in leg, unspecified: Secondary | ICD-10-CM | POA: Diagnosis not present

## 2014-05-17 LAB — URIC ACID: URIC ACID, SERUM: 4.2 mg/dL (ref 2.4–7.0)

## 2014-05-17 NOTE — Assessment & Plan Note (Signed)
New.  Unclear what caused pt's episode of acute swelling 2 weeks ago.  Will get doppler to r/o DVT.  Less likely but possible would be soft tissue gout.  Check UA level.  Ice, elevate, NSAIDs prn.  If pain persists, pt may need repeat vascular evaluation due to varicose veins of LLE.  Pt expressed understanding and is in agreement w/ plan.

## 2014-05-17 NOTE — Progress Notes (Signed)
   Subjective:    Patient ID: Joanne Edwards, female    DOB: [birth year], 74 y.o.   MRN: [id number]  HPI L leg pain and swelling- pt had redness and lower leg pain prior to flight to [place] 2 weeks ago.  When she arrived, she had increased swelling.  After eating dinner, leg was 2.5x normal size and she could 'barely walk'.  Pt started elevating, icing, and resumed ASA.  Pt reports it took 36 hrs for sxs to improve.  Continues to have some burning in lower leg- particularly at night.  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  Pt reports pain is different than her post polio pain- 'this is tissue pain'.  R ear pain- started while in [place].  Radiating into jaw.  Now having HA.  No fevers.  Review of Systems For ROS see HPI     Objective:   Physical Exam  Constitutional: She is oriented to person, place, and time. She appears well-developed and well-nourished. No distress.  HENT:  Head: Normocephalic and atraumatic.  Nose: Nose normal.  Mouth/Throat: Oropharynx is clear and moist. No oropharyngeal exudate.  R TM retracted w/ clear fluid visible L TM mildly retracted, no fluid visible  Eyes: Conjunctivae and EOM are normal. Pupils are equal, round, and reactive to light.  Cardiovascular: Normal rate, regular rhythm, normal heart sounds and intact distal pulses.   Pulmonary/Chest: Effort normal and breath sounds normal. No respiratory distress. She has no wheezes. She has no rales.  Musculoskeletal: She exhibits edema (mild nonpitting edema of L LE to just above ankle.  no erythema) and tenderness (TTP over LLE).  Neurological: She is alert and oriented to person, place, and time.  Skin: Skin is warm and dry. No erythema.  Vitals reviewed.         Assessment & Plan:

## 2014-05-17 NOTE — Assessment & Plan Note (Signed)
New.  No evidence of infxn as was pt's concern.  Reviewed need for daily antihistamine to decrease nasal congestion and restore normal function to eustachian tube.  Pt expressed understanding and is in agreement w/ plan.

## 2014-05-17 NOTE — Progress Notes (Signed)
Pre visit review using our clinic review tool, if applicable. No additional management support is needed unless otherwise documented below in the visit note. 

## 2014-05-17 NOTE — Patient Instructions (Signed)
Follow up as needed We'll notify you of your lab and ultrasound results Continue to ice and elevate your leg Tylenol/ibuprofen as needed for pain Continue Aspirin 81mg  daily Start Claritin/Zyrtec daily to decrease nasal congestion and improve the pressure in your ear (this is called eustachian tube dysfunction) Drink plenty of fluids Call with any questions or concerns Happy Valentine's Day!

## 2014-07-08 ENCOUNTER — Ambulatory Visit (INDEPENDENT_AMBULATORY_CARE_PROVIDER_SITE_OTHER): Payer: Commercial Managed Care - HMO | Admitting: Family Medicine

## 2014-07-08 ENCOUNTER — Other Ambulatory Visit: Payer: Self-pay | Admitting: General Practice

## 2014-07-08 ENCOUNTER — Encounter: Payer: Self-pay | Admitting: Family Medicine

## 2014-07-08 VITALS — BP 132/84 | HR 94 | Temp 98.0°F | Resp 16 | Wt 192.5 lb

## 2014-07-08 DIAGNOSIS — E785 Hyperlipidemia, unspecified: Secondary | ICD-10-CM

## 2014-07-08 LAB — BASIC METABOLIC PANEL
BUN: 19 mg/dL (ref 6–23)
CO2: 34 mEq/L — ABNORMAL HIGH (ref 19–32)
Calcium: 10.1 mg/dL (ref 8.4–10.5)
Chloride: 101 mEq/L (ref 96–112)
Creatinine, Ser: 0.8 mg/dL (ref 0.40–1.20)
GFR: 75.04 mL/min (ref >60.00)
Glucose, Bld: 90 mg/dL (ref 70–99)
Potassium: 4.5 mEq/L (ref 3.5–5.1)
Sodium: 138 mEq/L (ref 135–145)

## 2014-07-08 LAB — LIPID PANEL
CHOLESTEROL: 255 mg/dL — AB (ref 0–200)
HDL: 63.6 mg/dL (ref >39.00)
LDL CALC: 156 mg/dL — AB (ref 0–99)
NonHDL: 191.4
TRIGLYCERIDES: 176 mg/dL — AB (ref 0.0–149.0)
Total CHOL/HDL Ratio: 4
VLDL: 35.2 mg/dL (ref 0.0–40.0)

## 2014-07-08 LAB — HEPATIC FUNCTION PANEL
ALK PHOS: 58 U/L (ref 39–117)
ALT: 16 U/L (ref 0–35)
AST: 19 U/L (ref 0–37)
Albumin: 4.3 g/dL (ref 3.5–5.2)
BILIRUBIN DIRECT: 0.1 mg/dL (ref 0.0–0.3)
Total Bilirubin: 0.3 mg/dL (ref 0.2–1.2)
Total Protein: 7.4 g/dL (ref 6.0–8.3)

## 2014-07-08 MED ORDER — OMEPRAZOLE 20 MG PO CPDR: 20.0000 mg | DELAYED_RELEASE_CAPSULE | Freq: Two times a day (BID) | ORAL | Status: DC

## 2014-07-08 MED ORDER — SERTRALINE HCL 100 MG PO TABS: ORAL_TABLET | ORAL | Status: DC

## 2014-07-08 MED ORDER — PRAVASTATIN SODIUM 40 MG PO TABS: 40.0000 mg | ORAL_TABLET | Freq: Every day | ORAL | Status: DC

## 2014-07-08 MED ORDER — ATORVASTATIN CALCIUM 40 MG PO TABS: 40.0000 mg | ORAL_TABLET | Freq: Every day | ORAL | Status: DC

## 2014-07-08 NOTE — Assessment & Plan Note (Signed)
Chronic problem.  Tolerating statin w/o difficulty.  Asymptomatic.  Check labs.  Adjust meds prn. 

## 2014-07-08 NOTE — Progress Notes (Signed)
   Subjective:    Patient ID: Joanne Edwards, female    DOB: [birth year], 74 y.o.   MRN: [id number]  HPI Hyperlipidemia- chronic problem, on Pravastatin.  Going to the gym regularly.  Denies CP, SOB, HAs, visual changes, abd pain, N/V, myalgias   Review of Systems For ROS see HPI     Objective:   Physical Exam  Constitutional: She is oriented to person, place, and time. She appears well-developed and well-nourished. No distress.  HENT:  Head: Normocephalic and atraumatic.  Eyes: Conjunctivae and EOM are normal. Pupils are equal, round, and reactive to light.  Neck: Normal range of motion. Neck supple. No thyromegaly present.  Cardiovascular: Normal rate, regular rhythm, normal heart sounds and intact distal pulses.   No murmur heard. Pulmonary/Chest: Effort normal and breath sounds normal. No respiratory distress.  Abdominal: Soft. She exhibits no distension. There is no tenderness.  Musculoskeletal: She exhibits no edema.  Lymphadenopathy:    She has no cervical adenopathy.  Neurological: She is alert and oriented to person, place, and time.  Skin: Skin is warm and dry.  Psychiatric: She has a normal mood and affect. Her behavior is normal.  Vitals reviewed.         Assessment & Plan:

## 2014-07-08 NOTE — Progress Notes (Signed)
Pre visit review using our clinic review tool, if applicable. No additional management support is needed unless otherwise documented below in the visit note. 

## 2014-07-08 NOTE — Patient Instructions (Signed)
Schedule your complete physical in 6 months We'll notify you of your lab results and make any changes if needed Keep up the good work!  You look great! Call with any questions or concerns Happy Spring!!! 

## 2014-08-01 ENCOUNTER — Telehealth: Payer: Self-pay | Admitting: Family Medicine

## 2014-08-01 NOTE — Telephone Encounter (Signed)
Appointment scheduled.

## 2014-08-01 NOTE — Telephone Encounter (Signed)
[place] w/ me.  She's a very nice lady

## 2014-08-01 NOTE — Telephone Encounter (Signed)
Caller name: Carolene Relation to pt: self Call back number: [phone] Pharmacy:  Reason for call:   Patient would like to transfer from Dr. Birdie Riddle to Dr. Larose Kells. Is this ok? Thanks!

## 2014-08-01 NOTE — Telephone Encounter (Signed)
Ok w me, thanks

## 2014-08-01 NOTE — Telephone Encounter (Signed)
See below . Thanks

## 2014-08-16 ENCOUNTER — Telehealth: Payer: Self-pay | Admitting: Family Medicine

## 2014-08-16 DIAGNOSIS — Z1283 Encounter for screening for malignant neoplasm of skin: Secondary | ICD-10-CM

## 2014-08-16 NOTE — Telephone Encounter (Signed)
Relation to pt: self Call back number: [phone]   Reason for call:  Pt is requesting a referral to [facility] [address] [place],  [address] [phone]

## 2014-08-16 NOTE — Telephone Encounter (Signed)
Please advise, no referral in place

## 2014-08-17 NOTE — Telephone Encounter (Signed)
Ok to refer, ask pt about the dx

## 2014-08-17 NOTE — Telephone Encounter (Signed)
As of 4/25, she switched to Dr Larose Kells.  Cannot refer as I am no longer PCP

## 2014-08-17 NOTE — Telephone Encounter (Signed)
Referral placed.

## 2014-08-17 NOTE — Telephone Encounter (Signed)
Dr Larose Kells, please advise

## 2014-08-26 ENCOUNTER — Ambulatory Visit: Payer: Self-pay | Admitting: Family Medicine

## 2014-09-07 DIAGNOSIS — L821 Other seborrheic keratosis: Secondary | ICD-10-CM | POA: Diagnosis not present

## 2014-11-07 ENCOUNTER — Telehealth: Payer: Self-pay | Admitting: *Deleted

## 2014-11-07 ENCOUNTER — Encounter: Payer: Self-pay | Admitting: *Deleted

## 2014-11-07 NOTE — Telephone Encounter (Signed)
Unable to reach patient at time of Pre-Visit Call. Husband states she is out and to call back later in the day.

## 2014-11-07 NOTE — Telephone Encounter (Signed)
Pre-Visit Call completed with patient and chart updated.   Pre-Visit Info documented in Specialty Comments under [employer].    

## 2014-11-08 ENCOUNTER — Ambulatory Visit (INDEPENDENT_AMBULATORY_CARE_PROVIDER_SITE_OTHER): Payer: Commercial Managed Care - HMO | Admitting: Internal Medicine

## 2014-11-08 ENCOUNTER — Encounter: Payer: Self-pay | Admitting: Internal Medicine

## 2014-11-08 VITALS — BP 118/74 | HR 82 | Temp 98.1°F | Ht 64.0 in | Wt 195.0 lb

## 2014-11-08 DIAGNOSIS — F418 Other specified anxiety disorders: Secondary | ICD-10-CM | POA: Diagnosis not present

## 2014-11-08 DIAGNOSIS — E785 Hyperlipidemia, unspecified: Secondary | ICD-10-CM | POA: Diagnosis not present

## 2014-11-08 MED ORDER — PRAVASTATIN SODIUM 40 MG PO TABS: 40.0000 mg | ORAL_TABLET | Freq: Every day | ORAL | Status: DC

## 2014-11-08 NOTE — Assessment & Plan Note (Addendum)
Last LDL 156, she was recommended to change from Pravachol to atorvastatin but she is quite reluctant to do any change in her medications and  wonders if that is necessary. Her father had angina diagnosed in his mid 60s but no MIs. The patient is a nonsmoker, no history of high blood pressure. Based on that information and w/ her current level of LDL her cardiovascular risk factor is low. I think is ok to remain on Pravachol, will set that LDL goal of approximately 130. She is encouraged to continue working on her lifestyle. She agrees, will recheck a FLP on return to the office.

## 2014-11-08 NOTE — Progress Notes (Signed)
Subjective:    Patient ID: Joanne Edwards, female    DOB: 1940-05-31, 74 y.o.   MRN: [id number]  DOS:  11/08/2014 Type of visit - description : New patient, to get established Interval history: Hyperlipidemia: Based on the last cholesterol panel was recommended to switch to Lipitor, she has been quite reluctant to do that. Depression: Currently well controlled   Review of Systems Diet: Improving lately. She remains relatively active. Denies chest pain or difficulty breathing No nausea, vomiting, diarrhea or blood in the stools. No suicidal ideas.   Past Medical History  Diagnosis Date  . Arthritis   . Depression     lost a son 2011 (aneurysm)  . Hyperlipemia   . 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 Balance problem     going down steps    Past Surgical History  Procedure Laterality Date  . Oophorectomy Bilateral     was removed b/c mother hx of ovarian cancer  . Tummy tuck  1997  . Breast surgery      reduction  . Tonsillectomy    . Eye surgery      "fat removed from under R eye"  . 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 Breast cyst excision      History   Social History  . Marital Status: Unknown    Spouse Name: N/A  . Number of Children: 2  . Years of Education: N/A   Occupational History  . Retired Therapist, sports    Social History Main Topics  . Smoking status: Never Smoker   . Smokeless tobacco: Never Used  . Alcohol Use: 0.0 oz/week    0 Standard drinks or equivalent per week     Comment: rare  . Drug Use: No  . Sexual Activity: Yes   Other Topics Concern  . Not on file   Social History Narrative   Household- pt and husband (70 years)   Lost 1 of her 2 sons (2011)        Medication List       This list is accurate as of: 11/08/14  5:32 PM.  Always use your most recent med list.               clonazePAM 0.5 MG tablet  Commonly known as:  KLONOPIN  Take 1 tablet (0.5 mg total) by mouth as needed.     Melatonin 10 MG Tabs    Take 5 mg by mouth as needed.     multivitamin with minerals tablet  Take 1 tablet by mouth daily.     omeprazole 20 MG capsule  Commonly known as:  PRILOSEC  Take 1 capsule (20 mg total) by mouth 2 (two) times daily.     pravastatin 40 MG tablet  Commonly known as:  PRAVACHOL  Take 1 tablet (40 mg total) by mouth daily.     sertraline 100 MG tablet  Commonly known as:  ZOLOFT  Take 1 to 1 1/2 tablets as needed daily.     Vitamin D3 1000 UNITS Caps  Take 1 capsule by mouth daily.           Objective:   Physical Exam BP 118/74 mmHg  Pulse 82  Temp(Src) 98.1 F (36.7 C) (Oral)  Ht 5\' 4"  (1.626 m)  Wt 195 lb (88.451 kg)  BMI 33.46 kg/m2  SpO2 97% General:   Well developed, well nourished . NAD.  HEENT:  Normocephalic . Face symmetric, atraumatic Lungs:  CTA B Normal respiratory effort, no intercostal retractions, no accessory muscle use. Heart: RRR,  no murmur.  No pretibial edema bilaterally  Skin: Not pale. Not jaundice Neurologic:  alert & oriented X3.  Speech normal, gait appropriate for age and unassisted Psych--  Cognition and judgment appear intact.  Cooperative with normal attention span and concentration.  Behavior appropriate. No anxious or depressed appearing.      Assessment & Plan:

## 2014-11-08 NOTE — Progress Notes (Signed)
Pre visit review using our clinic review tool, if applicable. No additional management support is needed unless otherwise documented below in the visit note. 

## 2014-11-08 NOTE — Assessment & Plan Note (Signed)
Symptoms currently well controlled

## 2014-12-05 ENCOUNTER — Encounter: Payer: Self-pay | Admitting: Internal Medicine

## 2014-12-05 ENCOUNTER — Ambulatory Visit (INDEPENDENT_AMBULATORY_CARE_PROVIDER_SITE_OTHER): Payer: Commercial Managed Care - HMO | Admitting: Internal Medicine

## 2014-12-05 VITALS — BP 124/76 | HR 76 | Temp 98.3°F | Ht 64.0 in | Wt 193.5 lb

## 2014-12-05 DIAGNOSIS — L989 Disorder of the skin and subcutaneous tissue, unspecified: Secondary | ICD-10-CM

## 2014-12-05 MED ORDER — KETOCONAZOLE 2 % EX CREA: 1.0000 "application " | TOPICAL_CREAM | Freq: Two times a day (BID) | CUTANEOUS | Status: DC

## 2014-12-05 NOTE — Progress Notes (Signed)
Pre visit review using our clinic review tool, if applicable. No additional management support is needed unless otherwise documented below in the visit note. 

## 2014-12-05 NOTE — Progress Notes (Signed)
Subjective:    Patient ID: Joanne Edwards, female    DOB: December 03, 1940, 75 y.o.   MRN: [id number]  DOS:  12/05/2014 Type of visit - description : Acute Interval history:  Woke up this morning itching at the left side of the chest and noted a rash. No blisters, no pain, no other skin lesions. Other than the rash she feels well.  Review of Systems   Past Medical History  Diagnosis Date  . Arthritis   . Depression     lost a son 2011 (aneurysm)  . Hyperlipemia   . 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 Balance problem     going down steps    Past Surgical History  Procedure Laterality Date  . Oophorectomy Bilateral     was removed b/c mother hx of ovarian cancer  . Tummy tuck  1997  . Breast surgery      reduction  . Tonsillectomy    . Eye surgery      "fat removed from under R eye"  . 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 Breast cyst excision      Social History   Social History  . Marital Status: Unknown    Spouse Name: N/A  . Number of Children: 2  . Years of Education: N/A   Occupational History  . Retired Therapist, sports    Social History Main Topics  . Smoking status: Never Smoker   . Smokeless tobacco: Never Used  . Alcohol Use: 0.0 oz/week    0 Standard drinks or equivalent per week     Comment: rare  . Drug Use: No  . Sexual Activity: Yes   Other Topics Concern  . Not on file   Social History Narrative   Household- pt and husband (47 years)   Lost 1 of her 2 sons (2011)        Medication List       This list is accurate as of: 12/05/14  3:03 PM.  Always use your most recent med list.               clonazePAM 0.5 MG tablet  Commonly known as:  KLONOPIN  Take 1 tablet (0.5 mg total) by mouth as needed.     ketoconazole 2 % cream  Commonly known as:  NIZORAL  Apply 1 application topically 2 (two) times daily.     Melatonin 10 MG Tabs  Take 5 mg by mouth as needed.     multivitamin with minerals tablet  Take 1 tablet by  mouth daily.     omeprazole 20 MG capsule  Commonly known as:  PRILOSEC  Take 1 capsule (20 mg total) by mouth 2 (two) times daily.     pravastatin 40 MG tablet  Commonly known as:  PRAVACHOL  Take 1 tablet (40 mg total) by mouth daily.     sertraline 100 MG tablet  Commonly known as:  ZOLOFT  Take 1 to 1 1/2 tablets as needed daily.     Vitamin D3 1000 UNITS Caps  Take 1 capsule by mouth daily.           Objective:   Physical Exam  Constitutional: She appears well-developed and well-nourished. No distress.  Skin: She is not diaphoretic.     Psychiatric: She has a normal mood and affect. Her behavior is normal. Thought content normal.   BP 124/76 mmHg  Pulse  76  Temp(Src) 98.3 F (36.8 C) (Oral)  Ht 5\' 4"  (1.626 m)  Wt 193 lb 8 oz (87.771 kg)  BMI 33.20 kg/m2  SpO2 97%     Assessment & Plan:   Skin lesion: Likely fungal infection, prescribe ketoconazole. See instructions

## 2014-12-05 NOTE — Patient Instructions (Signed)
Apply the cream twice a day for 2 weeks. Call if no better at the end of the treatment

## 2014-12-23 ENCOUNTER — Ambulatory Visit: Payer: Commercial Managed Care - HMO

## 2014-12-23 DIAGNOSIS — Z23 Encounter for immunization: Secondary | ICD-10-CM

## 2014-12-23 MED ORDER — INFLUENZA VAC SPLIT QUAD 0.5 ML IM SUSY
0.5000 mL | PREFILLED_SYRINGE | Freq: Once | INTRAMUSCULAR | Status: AC
  Administered 2014-12-23: 0.5 mL via INTRAMUSCULAR

## 2015-02-20 ENCOUNTER — Telehealth: Payer: Self-pay

## 2015-02-21 ENCOUNTER — Encounter: Payer: Self-pay | Admitting: Internal Medicine

## 2015-02-21 ENCOUNTER — Ambulatory Visit (INDEPENDENT_AMBULATORY_CARE_PROVIDER_SITE_OTHER): Payer: Commercial Managed Care - HMO | Admitting: Internal Medicine

## 2015-02-21 VITALS — BP 118/68 | HR 77 | Temp 97.8°F | Ht 64.0 in | Wt 193.1 lb

## 2015-02-21 DIAGNOSIS — E049 Nontoxic goiter, unspecified: Secondary | ICD-10-CM

## 2015-02-21 DIAGNOSIS — Z0000 Encounter for general adult medical examination without abnormal findings: Secondary | ICD-10-CM | POA: Diagnosis not present

## 2015-02-21 DIAGNOSIS — E785 Hyperlipidemia, unspecified: Secondary | ICD-10-CM

## 2015-02-21 DIAGNOSIS — E010 Iodine-deficiency related diffuse (endemic) goiter: Secondary | ICD-10-CM

## 2015-02-21 DIAGNOSIS — M25561 Pain in right knee: Secondary | ICD-10-CM

## 2015-02-21 DIAGNOSIS — Z09 Encounter for follow-up examination after completed treatment for conditions other than malignant neoplasm: Secondary | ICD-10-CM

## 2015-02-21 DIAGNOSIS — E041 Nontoxic single thyroid nodule: Secondary | ICD-10-CM | POA: Insufficient documentation

## 2015-02-21 MED ORDER — SERTRALINE HCL 100 MG PO TABS: ORAL_TABLET | ORAL | Status: DC

## 2015-02-21 MED ORDER — OMEPRAZOLE 20 MG PO CPDR: 20.0000 mg | DELAYED_RELEASE_CAPSULE | Freq: Two times a day (BID) | ORAL | Status: DC

## 2015-02-21 NOTE — Progress Notes (Signed)
Pre visit review using our clinic review tool, if applicable. No additional management support is needed unless otherwise documented below in the visit note. 

## 2015-02-21 NOTE — Progress Notes (Signed)
Subjective:    Patient ID: Joanne Edwards, female    DOB: 1941-03-10, 74 y.o.   MRN: [id number]  DOS:  02/21/2015 Type of visit - description :  Here for Medicare AWV: 1. Risk factors based on Past M, S, F history: reviewed 2. Physical Activities:  Active most of the time  3. Depression/mood: lost a child, no acute depression 4. Hearing:  No problems noted or reported  5. ADL's: independent, drives  6. Fall Risk: had a recent falls, prevention discussed , see AVS 7. home Safety: does feel safe at home  8. Height, weight, & visual acuity: see VS, sees eye doctor regulalrly 9. Counseling: provided 10. Labs ordered based on risk factors: if needed  11. Referral Coordination: if needed 12. Care Plan, see assessment and plan , written personalized plan provided , see AVS 13. Cognitive Assessment: motor skills and cognition appropriate for age 81. Care team updated, see Snap Shot  15. End-of-life care discussed , has a HC POA  In addition, today we discussed the following: Had a fall , having pain in the right knee, request a orthopedic surgical referral. Hyperlipidemia: Still on Pravachol. Reluctant to take Lipitor until she sees her results   Review of Systems  Constitutional: No fever. No chills. No unexplained wt changes. No unusual sweats  HEENT: No dental problems, no ear discharge, no facial swelling, no voice changes. No eye discharge, no eye  redness , no  intolerance to light   Respiratory: No wheezing , no  difficulty breathing. No cough , no mucus production  Cardiovascular: No CP, no palpitations. Occasional leg swelling at the end of the day     GI: no nausea, no vomiting, no diarrhea , no  abdominal pain.  No blood in the stools. No dysphagia, no odynophagia    Endocrine: No polyphagia, no polyuria , no polydipsia  GU: No dysuria, gross hematuria, difficulty urinating. No urinary urgency, no frequency.  Musculoskeletal: No joint swellings or unusual aches or  pains  Skin: No change in the color of the skin, palor , no  Rash  Allergic, immunologic: No environmental allergies , no  food allergies  Neurological: No dizziness no  syncope. No headaches. No diplopia, no slurred, no slurred speech, no motor deficits, no facial  Numbness  Hematological: No enlarged lymph nodes, no easy bruising , no unusual bleedings  Psychiatry: No suicidal ideas, no hallucinations, no beavior problems, no confusion.  Good compliance with medication, still gets depressed from time to time since the lost of her son few years ago  Past Medical History  Diagnosis Date  . Arthritis   . Depression     lost a son 2011 (aneurysm)  . Hyperlipemia   . 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 Balance problem     going down steps    Past Surgical History  Procedure Laterality Date  . Oophorectomy Bilateral     was removed b/c mother hx of ovarian cancer  . Tummy tuck  1997  . Breast surgery      reduction  . Tonsillectomy    . Eye surgery      "fat removed from under R eye"  . 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 Breast cyst excision      Social History   Social History  . Marital Status: Unknown    Spouse Name: N/A  . Number of Children:  2  . Years of Education: N/A   Occupational History  . Retired Therapist, sports    Social History Main Topics  . Smoking status: Never Smoker   . Smokeless tobacco: Never Used  . Alcohol Use: 0.0 oz/week    0 Standard drinks or equivalent per week     Comment: rare  . Drug Use: No  . Sexual Activity: Yes   Other Topics Concern  . Not on file   Social History Narrative   Household- pt and husband (54 years)   Lost 1 of her 2 sons (2011)    Family History  Problem Relation Age of Onset  . Ovarian cancer Mother   . Colon cancer Neg Hx   . Esophageal cancer Neg Hx   . Rectal cancer Neg Hx   . Stomach cancer Neg Hx   . Heart attack Neg Hx   . CAD Father     h/o angina dx age 47s         Medication List       This list is accurate as of: 02/21/15 11:59 PM.  Always use your most recent med list.               clonazePAM 0.5 MG tablet  Commonly known as:  KLONOPIN  Take 1 tablet (0.5 mg total) by mouth as needed.     multivitamin with minerals tablet  Take 1 tablet by mouth daily.     omeprazole 20 MG capsule  Commonly known as:  PRILOSEC  Take 1 capsule (20 mg total) by mouth 2 (two) times daily.     pravastatin 40 MG tablet  Commonly known as:  PRAVACHOL  Take 1 tablet (40 mg total) by mouth daily.     sertraline 100 MG tablet  Commonly known as:  ZOLOFT  Take 1 to 1 1/2 tablets as needed daily.     Vitamin D3 1000 UNITS Caps  Take 1 capsule by mouth daily.           Objective:   Physical Exam BP 118/68 mmHg  Pulse 77  Temp(Src) 97.8 F (36.6 C) (Oral)  Ht 5\' 4"  (1.626 m)  Wt 193 lb 2 oz (87.601 kg)  BMI 33.13 kg/m2  SpO2 98% General:   Well developed, well nourished . NAD.  Neck:  Full range of motion. Right thyroid enlargement? Not tender. Not nodular. HEENT:  Normocephalic . Face symmetric, atraumatic. Breast: no dominant mass, skin and nipples normal to inspection on palpation, axillary areas without mass or lymphadenopathy Well-healed surgical scars. Lungs:  CTA B Normal respiratory effort, no intercostal retractions, no accessory muscle use. Heart: RRR,  no murmur.  No pretibial edema bilaterally  Abdomen:  Not distended, soft, non-tender. No rebound or rigidity.  Skin: Exposed areas without rash. Not pale. Not jaundice Neurologic:  alert & oriented X3.  Speech normal, gait appropriate for age and unassisted Strength symmetric and appropriate for age.  Psych: Cognition and judgment appear intact.  Cooperative with normal attention span and concentration.  Behavior appropriate. No anxious or depressed appearing.    Assessment & Plan:   Assessment > Hyperlipidemia DJD Depression, lost a son 2011 (dt aneurysm) Polio,  decreased muscle mass left leg Gait imbalance Varicose veins +FH Ovarian ca, status post bilateral oophorectomy  PLAN  Hyperlipidemia: Still on Pravachol, decided not to take Lipitor, likes to see a FLP first. Check labs: AST, ALT, FLP. Depression: Continue with sertraline and clonazepam. GERD: Continue with omeprazole, refills provided (R)  Thyromegaly? Check ultrasound, TSH. Knee pain: Right side, after a fall, refer to from Gait imbalance, offered PT  RTC 6 months

## 2015-02-21 NOTE — Patient Instructions (Signed)
Get your blood work before you leave   We will schedule to see orthopedic doctor  Will schedule a thyroid ultrasound  Next visit in 6 months, fasting.    Fall Prevention and Home Safety Falls cause injuries and can affect all age groups. It is possible to use preventive measures to significantly decrease the likelihood of falls. There are many simple measures which can make your home safer and prevent falls. OUTDOORS  Repair cracks and edges of walkways and driveways.  Remove high doorway thresholds.  Trim shrubbery on the main path into your home.  Have good outside lighting.  Clear walkways of tools, rocks, debris, and clutter.  Check that handrails are not broken and are securely fastened. Both sides of steps should have handrails.  Have leaves, snow, and ice cleared regularly.  Use sand or salt on walkways during winter months.  In the garage, clean up grease or oil spills. BATHROOM  Install night lights.  Install grab bars by the toilet and in the tub and shower.  Use non-skid mats or decals in the tub or shower.  Place a plastic non-slip stool in the shower to sit on, if needed.  Keep floors dry and clean up all water on the floor immediately.  Remove soap buildup in the tub or shower on a regular basis.  Secure bath mats with non-slip, double-sided rug tape.  Remove throw rugs and tripping hazards from the floors. BEDROOMS  Install night lights.  Make sure a bedside light is easy to reach.  Do not use oversized bedding.  Keep a telephone by your bedside.  Have a firm chair with side arms to use for getting dressed.  Remove throw rugs and tripping hazards from the floor. KITCHEN  Keep handles on pots and pans turned toward the center of the stove. Use back burners when possible.  Clean up spills quickly and allow time for drying.  Avoid walking on wet floors.  Avoid hot utensils and knives.  Position shelves so they are not too high or  low.  Place commonly used objects within easy reach.  If necessary, use a sturdy step stool with a grab bar when reaching.  Keep electrical cables out of the way.  Do not use floor polish or wax that makes floors slippery. If you must use wax, use non-skid floor wax.  Remove throw rugs and tripping hazards from the floor. STAIRWAYS  Never leave objects on stairs.  Place handrails on both sides of stairways and use them. Fix any loose handrails. Make sure handrails on both sides of the stairways are as long as the stairs.  Check carpeting to make sure it is firmly attached along stairs. Make repairs to worn or loose carpet promptly.  Avoid placing throw rugs at the top or bottom of stairways, or properly secure the rug with carpet tape to prevent slippage. Get rid of throw rugs, if possible.  Have an electrician put in a light switch at the top and bottom of the stairs. OTHER FALL PREVENTION TIPS  Wear low-heel or rubber-soled shoes that are supportive and fit well. Wear closed toe shoes.  When using a stepladder, make sure it is fully opened and both spreaders are firmly locked. Do not climb a closed stepladder.  Add color or contrast paint or tape to grab bars and handrails in your home. Place contrasting color strips on first and last steps.  Learn and use mobility aids as needed. Install an electrical emergency response system.  Turn on lights to avoid dark areas. Replace light bulbs that burn out immediately. Get light switches that glow.  Arrange furniture to create clear pathways. Keep furniture in the same place.  Firmly attach carpet with non-skid or double-sided tape.  Eliminate uneven floor surfaces.  Select a carpet pattern that does not visually hide the edge of steps.  Be aware of all pets. OTHER HOME SAFETY TIPS  Set the water temperature for 120 F (48.8 C).  Keep emergency numbers on or near the telephone.  Keep smoke detectors on every level of the  home and near sleeping areas. Document Released: 03/15/2002 Document Revised: 09/24/2011 Document Reviewed: 06/14/2011 [facility] Patient Information 2015 [place], [place]. This information is not intended to replace advice given to you by your health care provider. Make sure you discuss any questions you have with your health care provider.   Preventive Care for Adults Ages 66 and over  Blood pressure check.** / Every 1 to 2 years.  Lipid and cholesterol check.**/ Every 5 years beginning at age 66.  Lung cancer screening. / Every year if you are aged 40-80 years and have a 30-pack-year history of smoking and currently smoke or have quit within the past 15 years. Yearly screening is stopped once you have quit smoking for at least 15 years or develop a health problem that would prevent you from having lung cancer treatment.  Fecal occult blood test (FOBT) of stool. / Every year beginning at age 52 and continuing until age 79. You may not have to do this test if you get a colonoscopy every 10 years.  Flexible sigmoidoscopy** or colonoscopy.** / Every 5 years for a flexible sigmoidoscopy or every 10 years for a colonoscopy beginning at age 93 and continuing until age 34.  Hepatitis C blood test.** / For all people born from [birth year] through 1963 and any individual with known risks for hepatitis C.  Abdominal aortic aneurysm (AAA) screening.** / A one-time screening for ages 56 to 21 years who are current or former smokers.  Skin self-exam. / Monthly.  Influenza vaccine. / Every year.  Tetanus, diphtheria, and acellular pertussis (Tdap/Td) vaccine.** / 1 dose of Td every 10 years.  Varicella vaccine.** / Consult your health care provider.  Zoster vaccine.** / 1 dose for adults aged 62 years or older.  Pneumococcal 13-valent conjugate (PCV13) vaccine.** / Consult your health care provider.  Pneumococcal polysaccharide (PPSV23) vaccine.** / 1 dose for all adults aged 42 years and  older.  Meningococcal vaccine.** / Consult your health care provider.  Hepatitis A vaccine.** / Consult your health care provider.  Hepatitis B vaccine.** / Consult your health care provider.  Haemophilus influenzae type b (Hib) vaccine.** / Consult your health care provider. **Family history and personal history of risk and conditions may change your health care provider's recommendations. Document Released: 05/21/2001 Document Revised: 03/30/2013 Document Reviewed: 08/20/2010 [facility] Patient Information 2015 [place], [place]. This information is not intended to replace advice given to you by your health care provider. Make sure you discuss any questions you have with your health care provider.

## 2015-02-21 NOTE — Telephone Encounter (Signed)
Pre visit call completed 

## 2015-02-21 NOTE — Assessment & Plan Note (Addendum)
Flu vaccine--2016 Tdap-- 06/29/05 PNA-- 01/06/14 (13), 12/08/07 (23)  Shingles-- 12/08/07  Cervical cancer screening: Never had an abnormal PAP,married twice,  no recent one, was rec no further PAPs  S/p oophorectomy B Breast  cancer screening: Breast exam normal today, mammogram 04-2014 negative  Bone Density-- 04/21/14 at [facility]- normal   CCS-- 01/04/13 with Erskine Emery, MD- diverticulosis, internal hemorrhoids, otherwise normal f/u 10 years  EKG for baseline: NSR Diet and exercise discussed

## 2015-02-22 DIAGNOSIS — Z09 Encounter for follow-up examination after completed treatment for conditions other than malignant neoplasm: Secondary | ICD-10-CM | POA: Insufficient documentation

## 2015-02-22 LAB — LIPID PANEL
CHOL/HDL RATIO: 4
Cholesterol: 282 mg/dL — ABNORMAL HIGH (ref 0–200)
HDL: 66.7 mg/dL (ref >39.00)
LDL Cholesterol: 184 mg/dL — ABNORMAL HIGH (ref 0–99)
NonHDL: 215.73
Triglycerides: 160 mg/dL — ABNORMAL HIGH (ref 0.0–149.0)
VLDL: 32 mg/dL (ref 0.0–40.0)

## 2015-02-22 LAB — AST: AST: 18 U/L (ref 0–37)

## 2015-02-22 LAB — TSH: TSH: 1.69 u[IU]/mL (ref 0.35–4.50)

## 2015-02-22 LAB — ALT: ALT: 16 U/L (ref 0–35)

## 2015-02-22 NOTE — Assessment & Plan Note (Signed)
Hyperlipidemia: Still on Pravachol, decided not to take Lipitor, likes to see a FLP first. Check labs: AST, ALT, FLP. Depression: Continue with sertraline and clonazepam. GERD: Continue with omeprazole, refills provided (R) Thyromegaly? Check ultrasound, TSH. Knee pain: Right side, after a fall, refer to from Gait imbalance, offered PT  RTC 6 months

## 2015-02-23 MED ORDER — ROSUVASTATIN CALCIUM 20 MG PO TABS: 20.0000 mg | ORAL_TABLET | Freq: Every day | ORAL | Status: DC

## 2015-02-23 NOTE — Addendum Note (Signed)
Addended by: Wilfrid Lund on: 02/23/2015 08:07 AM   Modules accepted: Orders, Medications

## 2015-02-24 ENCOUNTER — Ambulatory Visit (HOSPITAL_BASED_OUTPATIENT_CLINIC_OR_DEPARTMENT_OTHER)
Admission: RE | Admit: 2015-02-24 | Discharge: 2015-02-24 | Disposition: A | Discharge status: Home / Self Care | Payer: Commercial Managed Care - HMO | Source: Ambulatory Visit | Attending: Internal Medicine | Admitting: Internal Medicine

## 2015-02-24 DIAGNOSIS — E042 Nontoxic multinodular goiter: Secondary | ICD-10-CM | POA: Insufficient documentation

## 2015-02-24 DIAGNOSIS — E010 Iodine-deficiency related diffuse (endemic) goiter: Secondary | ICD-10-CM | POA: Diagnosis not present

## 2015-02-27 ENCOUNTER — Encounter: Payer: Self-pay | Admitting: Internal Medicine

## 2015-03-21 ENCOUNTER — Other Ambulatory Visit: Payer: Self-pay | Admitting: Internal Medicine

## 2015-04-01 ENCOUNTER — Other Ambulatory Visit: Payer: Self-pay | Admitting: Family Medicine

## 2015-04-01 ENCOUNTER — Telehealth: Payer: Self-pay | Admitting: Internal Medicine

## 2015-04-01 MED ORDER — BUTENAFINE HCL 1 % EX CREA: TOPICAL_CREAM | CUTANEOUS | Status: DC

## 2015-04-03 ENCOUNTER — Encounter: Payer: Self-pay | Admitting: Internal Medicine

## 2015-04-06 ENCOUNTER — Other Ambulatory Visit: Payer: Commercial Managed Care - HMO

## 2015-05-02 ENCOUNTER — Other Ambulatory Visit (INDEPENDENT_AMBULATORY_CARE_PROVIDER_SITE_OTHER): Payer: Commercial Managed Care - HMO

## 2015-05-02 DIAGNOSIS — E785 Hyperlipidemia, unspecified: Secondary | ICD-10-CM | POA: Diagnosis not present

## 2015-05-02 LAB — AST: AST: 17 U/L (ref 0–37)

## 2015-05-02 LAB — LIPID PANEL
CHOL/HDL RATIO: 3
Cholesterol: 176 mg/dL (ref 0–200)
HDL: 60.7 mg/dL (ref >39.00)
LDL Cholesterol: 88 mg/dL (ref 0–99)
NONHDL: 115.05
Triglycerides: 135 mg/dL (ref 0.0–149.0)
VLDL: 27 mg/dL (ref 0.0–40.0)

## 2015-05-02 LAB — ALT: ALT: 20 U/L (ref 0–35)

## 2015-05-12 ENCOUNTER — Other Ambulatory Visit: Payer: Self-pay | Admitting: Internal Medicine

## 2015-05-12 MED ORDER — ROSUVASTATIN CALCIUM 20 MG PO TABS: 20.0000 mg | ORAL_TABLET | Freq: Every day | ORAL | Status: DC

## 2015-05-12 NOTE — Telephone Encounter (Signed)
Crestor refills sent.  

## 2015-06-01 DIAGNOSIS — Z139 Encounter for screening, unspecified: Secondary | ICD-10-CM | POA: Diagnosis not present

## 2015-06-01 DIAGNOSIS — Z1231 Encounter for screening mammogram for malignant neoplasm of breast: Secondary | ICD-10-CM | POA: Diagnosis not present

## 2015-08-16 ENCOUNTER — Encounter: Payer: Self-pay | Admitting: Internal Medicine

## 2015-08-21 ENCOUNTER — Ambulatory Visit (INDEPENDENT_AMBULATORY_CARE_PROVIDER_SITE_OTHER): Payer: Commercial Managed Care - HMO | Admitting: Internal Medicine

## 2015-08-21 ENCOUNTER — Encounter: Payer: Self-pay | Admitting: Internal Medicine

## 2015-08-21 VITALS — BP 122/66 | HR 78 | Temp 98.1°F | Ht 64.0 in | Wt 196.5 lb

## 2015-08-21 DIAGNOSIS — E041 Nontoxic single thyroid nodule: Secondary | ICD-10-CM | POA: Diagnosis not present

## 2015-08-21 DIAGNOSIS — E785 Hyperlipidemia, unspecified: Secondary | ICD-10-CM | POA: Diagnosis not present

## 2015-08-21 DIAGNOSIS — R682 Dry mouth, unspecified: Secondary | ICD-10-CM

## 2015-08-21 DIAGNOSIS — Z09 Encounter for follow-up examination after completed treatment for conditions other than malignant neoplasm: Secondary | ICD-10-CM

## 2015-08-21 NOTE — Patient Instructions (Signed)
  GO TO THE FRONT DESK Schedule your next appointment for a  Physical exam by 02-2016

## 2015-08-21 NOTE — Progress Notes (Signed)
Subjective:    Patient ID: Joanne Edwards, female    DOB: Dec 09, 1940, 75 y.o.   MRN: [id number]  DOS:  08/21/2015 Type of visit - description : Routine office visit  Interval history: High cholesterol: Good med compliance   Complain of a burning feeling at her tongue and dry mouth. Had similar symptoms 2 years ago and they have resurface few weeks ago. We discussed the results of her thyroid ultrasound.  Review of Systems Denies any gum bleeding, mouth ulcers or blisters. GERD well-controlled.   Past Medical History  Diagnosis Date  . Arthritis   . Depression     lost a son 2011 (aneurysm)  . Hyperlipemia   . 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 Balance problem     going down steps    Past Surgical History  Procedure Laterality Date  . Oophorectomy Bilateral     was removed b/c mother hx of ovarian cancer  . Tummy tuck  1997  . Breast surgery      reduction  . Tonsillectomy    . Eye surgery      "fat removed from under R eye"  . 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 Breast cyst excision      Social History   Social History  . Marital Status: Married    Spouse Name: N/A  . Number of Children: 2  . Years of Education: N/A   Occupational History  . Retired Therapist, sports    Social History Main Topics  . Smoking status: Never Smoker   . Smokeless tobacco: Never Used  . Alcohol Use: 0.0 oz/week    0 Standard drinks or equivalent per week     Comment: rare  . Drug Use: No  . Sexual Activity: Yes   Other Topics Concern  . Not on file   Social History Narrative   Household- pt and husband (58 years)   Lost 1 of her 2 sons (2011)        Medication List       This list is accurate as of: 08/21/15 11:59 PM.  Always use your most recent med list.               Butenafine HCl 1 % cream  Apply bid x 2 weeks     clonazePAM 0.5 MG tablet  Commonly known as:  KLONOPIN  Take 1 tablet (0.5 mg total) by mouth as needed.     multivitamin  with minerals tablet  Take 1 tablet by mouth daily.     omeprazole 20 MG capsule  Commonly known as:  PRILOSEC  Take 1 capsule (20 mg total) by mouth 2 (two) times daily.     rosuvastatin 20 MG tablet  Commonly known as:  CRESTOR  Take 1 tablet (20 mg total) by mouth at bedtime.     sertraline 100 MG tablet  Commonly known as:  ZOLOFT  Take 1 to 1 1/2 tablets as needed daily.     Vitamin D3 1000 units Caps  Take 1 capsule by mouth daily.           Objective:   Physical Exam BP 122/66 mmHg  Pulse 78  Temp(Src) 98.1 F (36.7 C) (Oral)  Ht 5\' 4"  (1.626 m)  Wt 196 lb 8 oz (89.132 kg)  BMI 33.71 kg/m2  SpO2 99% General:   Well developed, well nourished . NAD.  HEENT:  Normocephalic . Face symmetric, atraumatic.  Mouth: Slightly dry membranes without blisters, ulcers or lesions. Neck: Today I could not feel her thyroid upon palpation. No LADs. Lungs:  CTA B Normal respiratory effort, no intercostal retractions, no accessory muscle use. Heart: RRR,  no murmur.  No pretibial edema bilaterally  Skin: Not pale. Not jaundice Neurologic:  alert & oriented X3.  Speech normal, gait appropriate for age and unassisted Psych--  Cognition and judgment appear intact.  Cooperative with normal attention span and concentration.  Behavior appropriate. No anxious or depressed appearing.      Assessment & Plan:   Assessment > Hyperlipidemia -- changed pravachol to crestor 02-2016  DJD Depression, lost a son 2011 (dt aneurysm) Polio, decreased muscle mass left leg Gait imbalance Varicose veins +FH Ovarian ca, status post bilateral oophorectomy Thyroid cyst, complex per ----> [place] 02-2015: + complex cysts, no criteria for bx   PLAN Hyperlipidemia: Responded excellent to change from Pravachol to Crestor without apparent s/e Thyroid cysts: see [place] results from  02-2015,  on exam today there is no thyromegaly or neck LAD is. No FH of  thyroid cancer, no history of radiation  therapy. Consider recheck the thyroid ultrasound in 2-3 years Dry mouth: Complaining of dry mouth, on exam membranes are  slightly dry but no other findings. Tips to keep her mouth moist provided Edema: c/o LE edema if she sits for long time, we took about low salt, leg elevation pressure stockings RTC 6 months, CPX

## 2015-08-21 NOTE — Progress Notes (Signed)
Pre visit review using our clinic review tool, if applicable. No additional management support is needed unless otherwise documented below in the visit note. 

## 2015-08-22 NOTE — Assessment & Plan Note (Signed)
Hyperlipidemia: Responded excellent to change from Pravachol to Crestor without apparent s/e Thyroid cysts: see [place] results from  02-2015,  on exam today there is no thyromegaly or neck LAD is. No FH of  thyroid cancer, no history of radiation therapy. Consider recheck the thyroid ultrasound in 2-3 years Dry mouth: Complaining of dry mouth, on exam membranes are  slightly dry but no other findings. Tips to keep her mouth moist provided Edema: c/o LE edema if she sits for long time, we took about low salt, leg elevation pressure stockings RTC 6 months, CPX

## 2015-10-12 ENCOUNTER — Encounter: Payer: Self-pay | Admitting: Internal Medicine

## 2015-10-12 DIAGNOSIS — Z0100 Encounter for examination of eyes and vision without abnormal findings: Secondary | ICD-10-CM

## 2015-10-13 NOTE — Telephone Encounter (Signed)
Ophthalmology referral placed to Dr. Kathlen Mody at [facility] for routine eye exam as requested by Pt. Pt informed via MyChart that referral has been placed.

## 2015-10-30 DIAGNOSIS — H04123 Dry eye syndrome of bilateral lacrimal glands: Secondary | ICD-10-CM | POA: Diagnosis not present

## 2015-10-30 DIAGNOSIS — H25013 Cortical age-related cataract, bilateral: Secondary | ICD-10-CM | POA: Diagnosis not present

## 2015-10-30 DIAGNOSIS — H2513 Age-related nuclear cataract, bilateral: Secondary | ICD-10-CM | POA: Diagnosis not present

## 2015-10-30 DIAGNOSIS — D3132 Benign neoplasm of left choroid: Secondary | ICD-10-CM | POA: Diagnosis not present

## 2015-10-31 ENCOUNTER — Encounter: Payer: Self-pay | Admitting: Internal Medicine

## 2015-10-31 NOTE — Telephone Encounter (Signed)
error:[id number] ° °

## 2015-11-01 ENCOUNTER — Other Ambulatory Visit (INDEPENDENT_AMBULATORY_CARE_PROVIDER_SITE_OTHER): Payer: Commercial Managed Care - HMO

## 2015-11-01 ENCOUNTER — Ambulatory Visit (INDEPENDENT_AMBULATORY_CARE_PROVIDER_SITE_OTHER): Payer: Commercial Managed Care - HMO | Admitting: Neurology

## 2015-11-01 ENCOUNTER — Encounter: Payer: Self-pay | Admitting: Neurology

## 2015-11-01 VITALS — BP 116/68 | HR 80 | Ht 63.5 in

## 2015-11-01 DIAGNOSIS — G43709 Chronic migraine without aura, not intractable, without status migrainosus: Secondary | ICD-10-CM | POA: Diagnosis not present

## 2015-11-01 DIAGNOSIS — Z79899 Other long term (current) drug therapy: Secondary | ICD-10-CM | POA: Diagnosis not present

## 2015-11-01 LAB — BASIC METABOLIC PANEL
BUN: 16 mg/dL (ref 6–23)
CHLORIDE: 102 meq/L (ref 96–112)
CO2: 31 mEq/L (ref 19–32)
CREATININE: 0.71 mg/dL (ref 0.40–1.20)
Calcium: 9.9 mg/dL (ref 8.4–10.5)
GFR: 85.8 mL/min (ref >60.00)
Glucose, Bld: 94 mg/dL (ref 70–99)
Potassium: 4.2 mEq/L (ref 3.5–5.1)
Sodium: 139 mEq/L (ref 135–145)

## 2015-11-01 MED ORDER — TOPIRAMATE 25 MG PO TABS: ORAL_TABLET | ORAL | 0 refills | Status: DC

## 2015-11-01 NOTE — Progress Notes (Signed)
Chart forwarded.  

## 2015-11-01 NOTE — Patient Instructions (Addendum)
Migraine Recommendations: 1.  Start topiramate 25mg .  Take 1 tablet at bedtime for 7 days, then increase to 2 tablets at bedtime.  Call in 5 weeks with update and we can adjust dose if needed.  Possible side effects include: impaired thinking, sedation, paresthesias (numbness and tingling) and weight loss.  It may cause dehydration and there is a small risk for kidney stones, so make sure to stay hydrated with water during the day.  There is also a very small risk for glaucoma, so if you notice any change in your vision while taking this medication, see an ophthalmologist.   2.  Stop Tylenol.  Instead, take ibuprofen (200mg  to 800mg ).  Limit use to no more than 2 days out of the week. 3.  Limit use of pain relievers to no more than 2 days out of the week.  These medications include acetaminophen, ibuprofen, triptans and narcotics.  This will help reduce risk of rebound headaches. 4.  Be aware of common food triggers such as processed sweets, processed foods with nitrites (such as deli meat, hot dogs, sausages), foods with MSG, alcohol (such as wine), chocolate, certain cheeses, certain fruits (dried fruits, some citrus fruit), vinegar, diet soda. 4.  Avoid caffeine 5.  Routine exercise 6.  Proper sleep hygiene 7.  Stay adequately hydrated with water 8.  Keep a headache diary. 9.  Maintain proper stress management. 10.  Do not skip meals. 11.  Consider supplements:  Magnesium oxide 400mg  to 600mg  daily, riboflavin 400mg , Coenzyme Q 10 100mg  three times daily 12.  Follow up in 3 months.   Migraine Headache A migraine headache is an intense, throbbing pain on one or both sides of your head. A migraine can last for 30 minutes to several hours. CAUSES  The exact cause of a migraine headache is not always known. However, a migraine may be caused when nerves in the brain become irritated and release chemicals that cause inflammation. This causes pain. Certain things may also trigger migraines, such  as:  Alcohol.  Smoking.  Stress.  Menstruation.  Aged cheeses.  Foods or drinks that contain nitrates, glutamate, aspartame, or tyramine.  Lack of sleep.  Chocolate.  Caffeine.  Hunger.  Physical exertion.  Fatigue.  Medicines used to treat chest pain (nitroglycerine), birth control pills, estrogen, and some blood pressure medicines. SIGNS AND SYMPTOMS  Pain on one or both sides of your head.  Pulsating or throbbing pain.  Severe pain that prevents daily activities.  Pain that is aggravated by any physical activity.  Nausea, vomiting, or both.  Dizziness.  Pain with exposure to bright lights, loud noises, or activity.  General sensitivity to bright lights, loud noises, or smells. Before you get a migraine, you may get warning signs that a migraine is coming (aura). An aura may include:  Seeing flashing lights.  Seeing bright spots, halos, or zigzag lines.  Having tunnel vision or blurred vision.  Having feelings of numbness or tingling.  Having trouble talking.  Having muscle weakness. DIAGNOSIS  A migraine headache is often diagnosed based on:  Symptoms.  Physical exam.  A CT scan or MRI of your head. These imaging tests cannot diagnose migraines, but they can help rule out other causes of headaches. TREATMENT Medicines may be given for pain and nausea. Medicines can also be given to help prevent recurrent migraines.  HOME CARE INSTRUCTIONS  Only take over-the-counter or prescription medicines for pain or discomfort as directed by your health care provider. The use of  long-term narcotics is not recommended.  Lie down in a dark, quiet room when you have a migraine.  Keep a journal to find out what may trigger your migraine headaches. For example, write down:  What you eat and drink.  How much sleep you get.  Any change to your diet or medicines.  Limit alcohol consumption.  Quit smoking if you smoke.  Get 7-9 hours of sleep, or as  recommended by your health care provider.  Limit stress.  Keep lights dim if bright lights bother you and make your migraines worse. SEEK IMMEDIATE MEDICAL CARE IF:   Your migraine becomes severe.  You have a fever.  You have a stiff neck.  You have vision loss.  You have muscular weakness or loss of muscle control.  You start losing your balance or have trouble walking.  You feel faint or pass out.  You have severe symptoms that are different from your first symptoms. MAKE SURE YOU:   Understand these instructions.  Will watch your condition.  Will get help right away if you are not doing well or get worse.   This information is not intended to replace advice given to you by your health care provider. Make sure you discuss any questions you have with your health care provider.   Document Released: 03/25/2005 Document Revised: 04/15/2014 Document Reviewed: 11/30/2012 Elsevier Interactive Patient Education [employer].

## 2015-11-01 NOTE — Progress Notes (Signed)
NEUROLOGY CONSULTATION NOTE  Joanne Edwards MRN: [id number] DOB: 04/03/1941  Referring provider: Dr. Kathlen Mody Primary care provider: Dr. Larose Kells  Reason for consult:  headache  HISTORY OF PRESENT ILLNESS: Joanne Edwards is a 75 year old right-handed woman with hyperlipidemia, depression and history of polio who presents for headache.  History obtained by patient.  Onset:  She had her first migraine at age 51, presenting with visual aura of tunnel vision.  She was headache-free until 08/04/2009, following the death of her son. Location:  Usually right sided, from eye down to jaw and back of head Quality:  Boring  Intensity:  10/10 Aura:  no Prodrome:  no Associated symptoms:  Photophobia.  No nausea, phonophobia, visual disturbance, autonomic symptoms. Duration:  Several hours (20 minutes with Tylenol) Frequency:  Daily to every other day (around 15 headache days per month) Triggers/exacerbating factors:  Stress, strong smells, change in temperature, change in barometric pressure Relieving factors:  Rest, staying still, heat Activity:  When severe, needs to remain still  Past NSAIDS:  Aleve Past analgesics:  no Past abortive triptans:  no Past muscle relaxants:  no Past anti-emetic:  no Has never been on a preventative medication.  Current NSAIDS:  Occasionally ibuprofen Current analgesics:  Tylenol 1000mg  (daily) Current triptans:  no Current anti-emetic:  no Current muscle relaxants:  no Current anti-anxiolytic:  clonazepam Current sleep aide:  no Current Antihypertensive medications:  no Current Antidepressant medications:  sertraline Current Anticonvulsant medications:  no Current Vitamins/Herbal/Supplements:  D Current Antihistamines/Decongestants:  no Other therapy:  no  Caffeine:  tea Alcohol:  occasionally Smoker:  no Diet:  Needs to drink more water Exercise:  Needs to increase Depression/stress:  Some depression Sleep hygiene:  good Family history of headache:  No  family history of headache.  Her son passed away due to a cerebral aneurysm.  She recently had a formal eye exam on 10/30/15, which was unremarkable except for dry eyes, as per review of ophthalmology note.  BMP from Aug 05, 2014 showed Na 138, K 4.5, Cl 101, glucose 90, BUN 19 and Cr 0.80.  PAST MEDICAL HISTORY: Past Medical History:  Diagnosis Date  . Arthritis   . Balance problem    going down steps  . Depression    lost a son 08/04/2009 (aneurysm)  . Hyperlipemia   . Polio    as a child--loss muscle of lower left leg    PAST SURGICAL HISTORY: Past Surgical History:  Procedure Laterality Date  . BREAST CYST EXCISION    . BREAST SURGERY     reduction  . EYE SURGERY     "fat removed from under R eye"  . OOPHORECTOMY Bilateral    was removed b/c mother hx of ovarian cancer  . REPLACEMENT TOTAL KNEE  08-04-2004   right knee  . TONSILLECTOMY    . tummy tuck  1997    MEDICATIONS: Current Outpatient Prescriptions on File Prior to Visit  Medication Sig Dispense Refill  . Butenafine HCl 1 % cream Apply bid x 2 weeks 24 g 0  . Cholecalciferol (VITAMIN D3) 1000 UNITS CAPS Take 1 capsule by mouth daily.    . clonazePAM (KLONOPIN) 0.5 MG tablet Take 1 tablet (0.5 mg total) by mouth as needed. 90 tablet 1  . Multiple Vitamins-Minerals (MULTIVITAMIN WITH MINERALS) tablet Take 1 tablet by mouth daily.    Marland Kitchen omeprazole (PRILOSEC) 20 MG capsule Take 1 capsule (20 mg total) by mouth 2 (two) times daily. 180 capsule 3  .  rosuvastatin (CRESTOR) 20 MG tablet Take 1 tablet (20 mg total) by mouth at bedtime. 90 tablet 2  . sertraline (ZOLOFT) 100 MG tablet Take 1 to 1 1/2 tablets as needed daily. 135 tablet 3   No current facility-administered medications on file prior to visit.     ALLERGIES: No Known Allergies  FAMILY HISTORY: Family History  Problem Relation Age of Onset  . Ovarian cancer Mother   . CAD Father     h/o angina dx age 79s  . Aneurysm Son   . Colon cancer Neg Hx   . Esophageal  cancer Neg Hx   . Rectal cancer Neg Hx   . Stomach cancer Neg Hx   . Heart attack Neg Hx    .  SOCIAL HISTORY: Social History   Social History  . Marital status: Married    Spouse name: N/A  . Number of children: 2  . Years of education: N/A   Occupational History  . Retired Therapist, sports    Social History Main Topics  . Smoking status: Never Smoker  . Smokeless tobacco: Never Used  . Alcohol use 0.0 oz/week     Comment: rare  . Drug use: No  . Sexual activity: Yes   Other Topics Concern  . Not on file   Social History Narrative   Household- pt and husband (32 years)   Lost 1 of her 2 sons (2011)    REVIEW OF SYSTEMS: Constitutional: No fevers, chills, or sweats, no generalized fatigue, change in appetite Eyes: No visual changes, double vision, eye pain Ear, nose and throat: No hearing loss, ear pain, nasal congestion, sore throat Cardiovascular: No chest pain, palpitations Respiratory:  No shortness of breath at rest or with exertion, wheezes GastrointestinaI: No nausea, vomiting, diarrhea, abdominal pain, fecal incontinence Genitourinary:  No dysuria, urinary retention or frequency Musculoskeletal:  No neck pain, back pain Integumentary: No rash, pruritus, skin lesions Neurological: as above Psychiatric: depression Endocrine: No palpitations, fatigue, diaphoresis, mood swings, change in appetite, change in weight, increased thirst Hematologic/Lymphatic:  No purpura, petechiae. Allergic/Immunologic: no itchy/runny eyes, nasal congestion, recent allergic reactions, rashes  PHYSICAL EXAM: Vitals:   11/01/15 1455  BP: 116/68  Pulse: 80   General: No acute distress.  Patient appears well-groomed.  Head:  Normocephalic/atraumatic Eyes:  fundi examined but not visualized Neck: supple, no paraspinal tenderness, full range of motion Back: No paraspinal tenderness Heart: regular rate and rhythm Lungs: Clear to auscultation bilaterally. Vascular: No carotid  bruits. Neurological Exam: Mental status: alert and oriented to person, place, and time, recent and remote memory intact, fund of knowledge intact, attention and concentration intact, speech fluent and not dysarthric, language intact. Cranial nerves: CN I: not tested CN II: pupils equal, round and reactive to light, visual fields intact CN III, IV, VI:  full range of motion, no nystagmus, no ptosis CN V: facial sensation intact CN VII: upper and lower face symmetric CN VIII: hearing intact CN IX, X: gag intact, uvula midline CN XI: sternocleidomastoid and trapezius muscles intact CN XII: tongue midline Bulk & Tone: normal, no fasciculations. Motor:  5/5 throughout Sensation: temperature and vibration sensation intact. Deep Tendon Reflexes:  2+ throughout, toes downgoing.  Finger to nose testing:  Without dysmetria.  Heel to shin:  Without dysmetria.  Gait:  Mild left limp.  Able to turn and tandem walk. Romberg negative.  IMPRESSION: Chronic migraine, complicated by medication-overuse.  I don't suspect anything concerning given that she has had these headaches for several  years and exam is unremarkable.  PLAN: 1.  Initiate topiramate to dose of 50mg  at bedtime.  Check BMP. 2.  Stop Tylenol.  Use ibuprofen, limited to no more than 2 days out of the week. 3.  Discussed lifestyle modification 4.  Follow up in 3 months.  Contact us in 5 weeks with update.  Thank you for allowing me to take part in the care of this patient.  Metta Clines, DO  CC:  Kathlene November, MD  Marshall Cork, MD

## 2015-11-09 ENCOUNTER — Telehealth: Payer: Self-pay | Admitting: Neurology

## 2015-11-09 NOTE — Telephone Encounter (Signed)
Called patient back and she said that her mail order pharmacy only sent her 57 topamax and she wanted to know how Rx was written.  Informed her that it was for 60 pills.

## 2015-11-09 NOTE — Telephone Encounter (Signed)
PT called in regards to a mix up in her medication/Dawn CB#[phone]

## 2015-12-13 ENCOUNTER — Other Ambulatory Visit: Payer: Self-pay

## 2015-12-13 ENCOUNTER — Telehealth: Payer: Self-pay

## 2015-12-13 MED ORDER — TOPIRAMATE 50 MG PO TABS: 50.0000 mg | ORAL_TABLET | Freq: Every day | ORAL | 0 refills | Status: DC

## 2015-12-13 MED ORDER — TOPIRAMATE 50 MG PO TABS
50.0000 mg | ORAL_TABLET | Freq: Every day | ORAL | 1 refills | Status: DC
Start: 2015-12-13 — End: 2016-07-02

## 2015-12-13 NOTE — Telephone Encounter (Signed)
Pt called with 4 week update on Topamax. Pt states that medication does appear to be helping, she is also watching her diet and trying to stay away from triggers. She has been migraine free for 9 days! Pt has stopped taking tylenol, but is still occasionally taking ibuprofen. 2 day/week pain reliever limit was reiterated to patient. Pt requested refills sent to both mail order and local (short 10 day supply as she is out of medication tomorrow)

## 2015-12-27 ENCOUNTER — Ambulatory Visit: Payer: Commercial Managed Care - HMO | Admitting: Neurology

## 2016-02-06 ENCOUNTER — Encounter: Payer: Self-pay | Admitting: Internal Medicine

## 2016-02-06 ENCOUNTER — Ambulatory Visit (INDEPENDENT_AMBULATORY_CARE_PROVIDER_SITE_OTHER): Payer: Commercial Managed Care - HMO | Admitting: Internal Medicine

## 2016-02-06 VITALS — BP 125/80 | HR 73 | Temp 97.5°F | Ht 65.0 in | Wt 197.2 lb

## 2016-02-06 DIAGNOSIS — Z23 Encounter for immunization: Secondary | ICD-10-CM

## 2016-02-06 DIAGNOSIS — N3941 Urge incontinence: Secondary | ICD-10-CM | POA: Diagnosis not present

## 2016-02-06 DIAGNOSIS — R32 Unspecified urinary incontinence: Secondary | ICD-10-CM | POA: Insufficient documentation

## 2016-02-06 NOTE — Progress Notes (Signed)
Pre visit review using our clinic review tool, if applicable. No additional management support is needed unless otherwise documented below in the visit note. 

## 2016-02-06 NOTE — Patient Instructions (Signed)
Please go to the lab: Provide a urine sample  Try to time you voiding every 2-3 hours  If not better please let me know

## 2016-02-06 NOTE — Progress Notes (Signed)
Subjective:    Patient ID: Joanne Edwards, female    DOB: [birth year], 75 y.o.   MRN: [id number]  DOS:  02/06/2016 Type of visit - description : acute Interval history: C/o  urinary incontinence since approximately April 2017. Typically she feels the urge to go and by the time she reach the bathroom she has leak already. Denies any incontinence  with cough or laughing. Also requests her shots   Review of Systems Denies any back pain, dysuria, gross hematuria difficulty urinating  Past Medical History:  Diagnosis Date  . Arthritis   . Balance problem    going down steps  . Depression    lost a son 2011 (aneurysm)  . Hyperlipemia   . Polio    as a child--loss muscle of lower left leg    Past Surgical History:  Procedure Laterality Date  . BREAST CYST EXCISION    . BREAST SURGERY     reduction  . EYE SURGERY     "fat removed from under R eye"  . OOPHORECTOMY Bilateral    was removed b/c mother hx of ovarian cancer  . REPLACEMENT TOTAL KNEE  2006   right knee  . TONSILLECTOMY    . tummy tuck  1997    Social History   Social History  . Marital status: Married    Spouse name: N/A  . Number of children: 2  . Years of education: N/A   Occupational History  . Retired Therapist, sports    Social History Main Topics  . Smoking status: Never Smoker  . Smokeless tobacco: Never Used  . Alcohol use 0.0 oz/week     Comment: rare  . Drug use: No  . Sexual activity: Yes   Other Topics Concern  . Not on file   Social History Narrative   Household- pt and husband (13 years)   Lost 1 of her 2 sons (2011)        Medication List       Accurate as of 02/06/16 11:59 PM. Always use your most recent med list.          Butenafine HCl 1 % cream Apply bid x 2 weeks   clonazePAM 0.5 MG tablet Commonly known as:  KLONOPIN Take 1 tablet (0.5 mg total) by mouth as needed.   multivitamin with minerals tablet Take 1 tablet by mouth daily.   omeprazole 20 MG capsule Commonly known  as:  PRILOSEC Take 1 capsule (20 mg total) by mouth 2 (two) times daily.   rosuvastatin 20 MG tablet Commonly known as:  CRESTOR Take 1 tablet (20 mg total) by mouth at bedtime.   sertraline 100 MG tablet Commonly known as:  ZOLOFT Take 1 to 1 1/2 tablets as needed daily.   topiramate 50 MG tablet Commonly known as:  TOPAMAX Take 1 tablet (50 mg total) by mouth at bedtime.   Vitamin D3 1000 units Caps Take 1 capsule by mouth daily.          Objective:   Physical Exam BP 125/80 (BP Location: Right Arm, Patient Position: Sitting, Cuff Size: Large)   Pulse 73   Temp 97.5 F (36.4 C) (Oral)   Ht 5\' 5"  (1.651 m)   Wt 197 lb 3.2 oz (89.4 kg)   SpO2 98%   BMI 32.82 kg/m  General:   Well developed, well nourished . NAD.  HEENT:  Normocephalic . Face symmetric, atraumatic Abdomen: Not distended, soft, nontender. No CVA tenderness Skin: Not pale. Not  jaundice Neurologic:  alert & oriented X3.  Speech normal, gait appropriate for age and unassisted Psych--  Cognition and judgment appear intact.  Cooperative with normal attention span and concentration.  Behavior appropriate. No anxious or depressed appearing.     Assessment & Plan:   Assessment > Hyperlipidemia -- changed pravachol to crestor 02-2016  DJD Depression, lost a son 2011 (dt aneurysm) Polio, decreased muscle mass left leg Gait imbalance Varicose veins +FH Ovarian ca, status post bilateral oophorectomy Thyroid cyst, complex per ----> [place] 02-2015: + complex cysts, no criteria for bx   PLAN Urinary incontinence as described above: Rec  timed voiding, check a UA, urine culture. If not better consider refer to Dr. Quincy Simmonds or urology. Primary care: Flu and Td shots today. RTC already scheduled for a physical next month

## 2016-02-07 LAB — URINALYSIS, ROUTINE W REFLEX MICROSCOPIC
BILIRUBIN URINE: NEGATIVE
HGB URINE DIPSTICK: NEGATIVE
Ketones, ur: NEGATIVE
NITRITE: NEGATIVE
PH: 6 (ref 5.0–8.0)
RBC / HPF: NONE SEEN (ref 0–?)
Specific Gravity, Urine: <=1.005 — AB (ref 1.000–1.030)
TOTAL PROTEIN, URINE-UPE24: NEGATIVE
Urine Glucose: NEGATIVE
Urobilinogen, UA: 0.2 (ref 0.0–1.0)

## 2016-02-07 LAB — URINE CULTURE

## 2016-02-08 ENCOUNTER — Encounter: Payer: Self-pay | Admitting: Neurology

## 2016-02-08 ENCOUNTER — Ambulatory Visit (INDEPENDENT_AMBULATORY_CARE_PROVIDER_SITE_OTHER): Payer: Commercial Managed Care - HMO | Admitting: Neurology

## 2016-02-08 VITALS — BP 122/64 | HR 80 | Ht 65.0 in | Wt 195.0 lb

## 2016-02-08 DIAGNOSIS — G43009 Migraine without aura, not intractable, without status migrainosus: Secondary | ICD-10-CM | POA: Diagnosis not present

## 2016-02-08 NOTE — Patient Instructions (Signed)
1.  Continue topiramate 50mg  at bedtime 2.  Try to limit use of ibuprofen to no more than 2 days a week if possible 3.  Follow up in 8 months

## 2016-02-08 NOTE — Assessment & Plan Note (Signed)
Urinary incontinence as described above: Rec  timed voiding, check a UA, urine culture. If not better consider refer to Dr. Quincy Simmonds or urology. Primary care: Flu and Td shots today. RTC already scheduled for a physical next month

## 2016-02-08 NOTE — Progress Notes (Signed)
NEUROLOGY FOLLOW UP OFFICE NOTE  KATIANA CAPPIELLO [id number]  HISTORY OF PRESENT ILLNESS: Joanne Edwards is a 75 year old right-handed woman with hyperlipidemia, depression and history of polio who follows up for chronic migraine.  UPDATE: She is doing well.  Her last migraine was a month ago.  She averages about 1 migraine a month.  Every morning, she wakes up with heaviness in the head, so she takes ibuprofen.  She takes about 2 ibuprofen daily.  She stopped Tylenol.  She often takes ibuprofen daily for lower extremity pain and swelling as well. Current NSAIDS:  ibuprofen Current analgesics:  no Current triptans:  no Current anti-emetic:  no Current muscle relaxants:  no Current anti-anxiolytic:  clonazepam Current sleep aide:  no Current Antihypertensive medications:  no Current Antidepressant medications:  Sertraline 100mg  Current Anticonvulsant medications:  topiramate 50mg  Current Vitamins/Herbal/Supplements:  D Current Antihistamines/Decongestants:  no Other therapy:  no   Caffeine:  tea Alcohol:  occasionally Smoker:  no Diet:  Needs to drink more water Exercise:  Needs to increase Depression/stress:  Some depression Sleep hygiene:  Good  11/01/15:  BMP with Na 139, K 4.2, BUN 16, Cr 0.71  HISTORY:  Onset:  She had her first migraine at age 75, presenting with visual aura of tunnel vision.  She was headache-free until 2009/07/17, following the death of her son. Location:  Usually right sided, from eye down to jaw and back of head Quality:  Boring  Intensity:  10/10 Aura:  no Prodrome:  no Associated symptoms:  Photophobia.  No nausea, phonophobia, visual disturbance, autonomic symptoms. Duration:  Several hours (20 minutes with Tylenol) Frequency:  Daily to every other day (around 15 headache days per month) Triggers/exacerbating factors:  Stress, strong smells, change in temperature, change in barometric pressure Relieving factors:  Rest, staying still, heat Activity:  When  severe, needs to remain still   Past NSAIDS:  Aleve Past analgesics:  Tylenol Past abortive triptans:  no Past muscle relaxants:  no Past anti-emetic:  no Has never been on a preventative medication.   Family history of headache:  No family history of headache.  Her son passed away due to a cerebral aneurysm.  PAST MEDICAL HISTORY: Past Medical History:  Diagnosis Date  . Arthritis   . Balance problem    going down steps  . Depression    lost a son 2009/07/17 (aneurysm)  . Hyperlipemia   . Polio    as a child--loss muscle of lower left leg    MEDICATIONS: Current Outpatient Prescriptions on File Prior to Visit  Medication Sig Dispense Refill  . Cholecalciferol (VITAMIN D3) 1000 UNITS CAPS Take 1 capsule by mouth daily.    . clonazePAM (KLONOPIN) 0.5 MG tablet Take 1 tablet (0.5 mg total) by mouth as needed. 90 tablet 1  . Multiple Vitamins-Minerals (MULTIVITAMIN WITH MINERALS) tablet Take 1 tablet by mouth daily.    Marland Kitchen omeprazole (PRILOSEC) 20 MG capsule Take 1 capsule (20 mg total) by mouth 2 (two) times daily. 180 capsule 3  . rosuvastatin (CRESTOR) 20 MG tablet Take 1 tablet (20 mg total) by mouth at bedtime. 90 tablet 2  . sertraline (ZOLOFT) 100 MG tablet Take 1 to 1 1/2 tablets as needed daily. 135 tablet 3  . topiramate (TOPAMAX) 50 MG tablet Take 1 tablet (50 mg total) by mouth at bedtime. 90 tablet 1   No current facility-administered medications on file prior to visit.     ALLERGIES: No Known Allergies  FAMILY HISTORY: Family History  Problem Relation Age of Onset  . Ovarian cancer Mother   . CAD Father     h/o angina dx age 54s  . Aneurysm Son   . Colon cancer Neg Hx   . Esophageal cancer Neg Hx   . Rectal cancer Neg Hx   . Stomach cancer Neg Hx   . Heart attack Neg Hx     SOCIAL HISTORY: Social History   Social History  . Marital status: Married    Spouse name: N/A  . Number of children: 2  . Years of education: N/A   Occupational History  .  Retired Therapist, sports    Social History Main Topics  . Smoking status: Never Smoker  . Smokeless tobacco: Never Used  . Alcohol use 0.0 oz/week     Comment: rare  . Drug use: No  . Sexual activity: Yes   Other Topics Concern  . Not on file   Social History Narrative   Household- pt and husband (50 years)   Lost 1 of her 2 sons (2011)    REVIEW OF SYSTEMS: Constitutional: No fevers, chills, or sweats, no generalized fatigue, change in appetite Eyes: No visual changes, double vision, eye pain Ear, nose and throat: No hearing loss, ear pain, nasal congestion, sore throat Cardiovascular: No chest pain, palpitations Respiratory:  No shortness of breath at rest or with exertion, wheezes GastrointestinaI: No nausea, vomiting, diarrhea, abdominal pain, fecal incontinence Genitourinary:  No dysuria, urinary retention or frequency Musculoskeletal:  No neck pain, back pain Integumentary: No rash, pruritus, skin lesions Neurological: as above Psychiatric: No depression, insomnia, anxiety Endocrine: No palpitations, fatigue, diaphoresis, mood swings, change in appetite, change in weight, increased thirst Hematologic/Lymphatic:  No purpura, petechiae. Allergic/Immunologic: no itchy/runny eyes, nasal congestion, recent allergic reactions, rashes  PHYSICAL EXAM: Vitals:   02/08/16 0931  BP: 122/64  Pulse: 80   General: No acute distress.  Patient appears well-groomed.  normal body habitus. Head:  Normocephalic/atraumatic Eyes:  Fundi examined but not visualized Neck: supple, no paraspinal tenderness, full range of motion Heart:  Regular rate and rhythm Lungs:  Clear to auscultation bilaterally Back: No paraspinal tenderness Neurological Exam: alert and oriented to person, place, and time. Attention span and concentration intact, recent and remote memory intact, fund of knowledge intact.  Speech fluent and not dysarthric, language intact.  CN II-XII intact. Bulk and tone normal, muscle strength  5/5 throughout.  Sensation to light touch  intact.  Deep tendon reflexes 2+ throughout.  Finger to nose testing intact.  Gait normal, Romberg negative.  IMPRESSION: Migraine without aura, improved  PLAN: Topamax 50mg  at bedtime Advised to try and limit use of ibuprofen to no more than 2 days out of the month. Follow up in 7 to 8 months.  25 minutes spent face to face with patient, over 50% spent counseling.  Metta Clines, DO  CC:  Kathlene November, MD

## 2016-02-27 ENCOUNTER — Ambulatory Visit (INDEPENDENT_AMBULATORY_CARE_PROVIDER_SITE_OTHER): Payer: Commercial Managed Care - HMO | Admitting: Internal Medicine

## 2016-02-27 ENCOUNTER — Encounter: Payer: Self-pay | Admitting: Internal Medicine

## 2016-02-27 VITALS — BP 124/76 | HR 78 | Temp 98.1°F | Resp 12 | Ht 65.0 in | Wt 194.5 lb

## 2016-02-27 DIAGNOSIS — E041 Nontoxic single thyroid nodule: Secondary | ICD-10-CM

## 2016-02-27 DIAGNOSIS — Z0000 Encounter for general adult medical examination without abnormal findings: Secondary | ICD-10-CM | POA: Diagnosis not present

## 2016-02-27 LAB — CBC WITH DIFFERENTIAL/PLATELET
BASOS PCT: 0.5 % (ref 0.0–3.0)
Basophils Absolute: 0 10*3/uL (ref 0.0–0.1)
EOS ABS: 0.1 10*3/uL (ref 0.0–0.7)
EOS PCT: 1.8 % (ref 0.0–5.0)
HEMATOCRIT: 38.5 % (ref 36.0–46.0)
Hemoglobin: 13 g/dL (ref 12.0–15.0)
LYMPHS PCT: 27.8 % (ref 12.0–46.0)
Lymphs Abs: 1.4 10*3/uL (ref 0.7–4.0)
MCHC: 33.9 g/dL (ref 30.0–36.0)
MCV: 91.6 fl (ref 78.0–100.0)
MONO ABS: 0.4 10*3/uL (ref 0.1–1.0)
Monocytes Relative: 7.5 % (ref 3.0–12.0)
NEUTROS ABS: 3.2 10*3/uL (ref 1.4–7.7)
Neutrophils Relative %: 62.4 % (ref 43.0–77.0)
PLATELETS: 206 10*3/uL (ref 150.0–400.0)
RBC: 4.2 Mil/uL (ref 3.87–5.11)
RDW: 13.1 % (ref 11.5–15.5)
WBC: 5.1 10*3/uL (ref 4.0–10.5)

## 2016-02-27 LAB — LIPID PANEL
CHOLESTEROL: 171 mg/dL (ref 0–200)
HDL: 73.1 mg/dL (ref >39.00)
LDL CALC: 79 mg/dL (ref 0–99)
NonHDL: 98.09
TRIGLYCERIDES: 96 mg/dL (ref 0.0–149.0)
Total CHOL/HDL Ratio: 2
VLDL: 19.2 mg/dL (ref 0.0–40.0)

## 2016-02-27 LAB — AST: AST: 18 U/L (ref 0–37)

## 2016-02-27 LAB — ALT: ALT: 20 U/L (ref 0–35)

## 2016-02-27 LAB — TSH: TSH: 2.66 u[IU]/mL (ref 0.35–4.50)

## 2016-02-27 MED ORDER — ROSUVASTATIN CALCIUM 20 MG PO TABS: 20.0000 mg | ORAL_TABLET | Freq: Every day | ORAL | 2 refills | Status: DC

## 2016-02-27 MED ORDER — DARIFENACIN HYDROBROMIDE ER 7.5 MG PO TB24: 7.5000 mg | ORAL_TABLET | Freq: Every day | ORAL | 3 refills | Status: DC

## 2016-02-27 NOTE — Assessment & Plan Note (Addendum)
Tdap-- 01-2016;  PNA-- 01/06/14 (13), 12/08/07 (23) ;  Shingles-- 12/08/07; had a flu shot  Cervical cancer screening: no further PAPs  S/p oophorectomy B Breast  cancer screening: Breast exam normal 1016;  mammogram 04-2014 negative; states got a MMG 2017, will bring report  Bone Density-- 04/21/14 at [facility]- normal . Recommend calcium, vitamin D.  CCS-- 01/04/13 with Erskine Emery, MD- diverticulosis, internal hemorrhoids,  f/u 10 years  Doing great with diet and exercise. Counseled about fall prevention and her power of attorney    labs: AST, ALT, FLP, CBC, vitamin D, TSH.

## 2016-02-27 NOTE — Progress Notes (Signed)
Subjective:    Patient ID: Joanne Edwards, female    DOB: 06-04-1940, 75 y.o.   MRN: [id number]  DOS:  02/27/2016 Type of visit - description : cpx Interval history: In general feeling well, she continue with urinary  urgency, frequency and occasional accidents. No dysuria, gross hematuria. Meds reviewed, good compliance without apparent side effects Labs reviewed   Review of Systems Constitutional: No fever. No chills. No unexplained wt changes. No unusual sweats  HEENT: No dental problems, no ear discharge, no facial swelling, no voice changes. No eye discharge, no eye  redness , no  intolerance to light   Respiratory: No wheezing , no  difficulty breathing. No cough , no mucus production  Cardiovascular: No CP, no leg swelling , no  Palpitations  GI: no nausea, no vomiting, no diarrhea , no  abdominal pain.  No blood in the stools. No dysphagia, no odynophagia    Endocrine: No polyphagia, no polyuria , no polydipsia  GU: See above Musculoskeletal: No joint swellings or unusual aches or pains  Skin: No change in the color of the skin, palor , no  Rash  Allergic, immunologic: No environmental allergies , no  food allergies  Neurological: No dizziness no  syncope. No headaches. No diplopia, no slurred, no slurred speech, no motor deficits, no facial  Numbness  Hematological: No enlarged lymph nodes, no easy bruising , no unusual bleedings  Psychiatry: No suicidal ideas, no hallucinations, no beavior problems, no confusion.  No unusual/severe anxiety, no depression   Past Medical History:  Diagnosis Date  . Arthritis   . Balance problem    going down steps  . Depression    lost a son 2011 (aneurysm)  . Hyperlipemia   . Polio    as a child--loss muscle of lower left leg    Past Surgical History:  Procedure Laterality Date  . BREAST CYST EXCISION    . BREAST SURGERY     reduction  . EYE SURGERY     "fat removed from under R eye"  . OOPHORECTOMY Bilateral    was  removed b/c mother hx of ovarian cancer  . REPLACEMENT TOTAL KNEE  2006   right knee  . TONSILLECTOMY    . tummy tuck  1997    Social History   Social History  . Marital status: Married    Spouse name: N/A  . Number of children: 2  . Years of education: N/A   Occupational History  . Retired Therapist, sports    Social History Main Topics  . Smoking status: Never Smoker  . Smokeless tobacco: Never Used  . Alcohol use 0.0 oz/week     Comment: rare  . Drug use: No  . Sexual activity: Yes   Other Topics Concern  . Not on file   Social History Narrative   Household- pt and husband (97 years)   Lost 1 of her 2 sons (2011)     Family History  Problem Relation Age of Onset  . Ovarian cancer Mother   . CAD Father     h/o angina dx age 76s  . Aneurysm Son   . Colon cancer Neg Hx   . Esophageal cancer Neg Hx   . Rectal cancer Neg Hx   . Stomach cancer Neg Hx   . Heart attack Neg Hx   . Breast cancer Neg Hx        Medication List       Accurate as of 02/27/16 11:59  PM. Always use your most recent med list.          clonazePAM 0.5 MG tablet Commonly known as:  KLONOPIN Take 1 tablet (0.5 mg total) by mouth as needed.   darifenacin 7.5 MG 24 hr tablet Commonly known as:  ENABLEX Take 1 tablet (7.5 mg total) by mouth daily.   multivitamin with minerals tablet Take 1 tablet by mouth daily.   omeprazole 20 MG capsule Commonly known as:  PRILOSEC Take 1 capsule (20 mg total) by mouth 2 (two) times daily.   rosuvastatin 20 MG tablet Commonly known as:  CRESTOR Take 1 tablet (20 mg total) by mouth at bedtime.   sertraline 100 MG tablet Commonly known as:  ZOLOFT Take 1 to 1 1/2 tablets as needed daily.   topiramate 50 MG tablet Commonly known as:  TOPAMAX Take 1 tablet (50 mg total) by mouth at bedtime.   Vitamin D3 1000 units Caps Take 1 capsule by mouth daily.          Objective:   Physical Exam BP 124/76 (BP Location: Left Arm, Patient Position: Sitting,  Cuff Size: Normal)   Pulse 78   Temp 98.1 F (36.7 C) (Oral)   Resp 12   Ht 5\' 5"  (1.651 m)   Wt 194 lb 8 oz (88.2 kg)   SpO2 98%   BMI 32.37 kg/m   General:   Well developed, well nourished . NAD.  Neck: Right side of the thyroid gland slightly large, minimal, not tender  HEENT:  Normocephalic . Face symmetric, atraumatic Lungs:  CTA B Normal respiratory effort, no intercostal retractions, no accessory muscle use. Heart: RRR,  no murmur.  No pretibial edema bilaterally  Abdomen:  Not distended, soft, non-tender. No rebound or rigidity.   Skin: Exposed areas without rash. Not pale. Not jaundice Neurologic:  alert & oriented X3.  Speech normal, gait appropriate for age and unassisted Strength symmetric and appropriate for age.  Psych: Cognition and judgment appear intact.  Cooperative with normal attention span and concentration.  Behavior appropriate. No anxious or depressed appearing.    Assessment & Plan:   Assessment  Hyperlipidemia -- changed pravachol to crestor 02-2016  DJD Depression, lost a son 2011 (dt aneurysm) Migraines (sees Dr Tomi Likens  Polio, decreased muscle mass left leg Gait imbalance Varicose veins +FH Ovarian ca, status post bilateral oophorectomy Thyroid cyst, complex per ----> [place] 02-2015: + complex cysts, no criteria for bx   PLAN Hyperlipidemia: On Crestor, check a FLP, AST, ALT Depression:   controlled Thyroid cyst (per U/S): Exam today is stable compared to last year, will get a follow-up ultrasound and TSH. Urgency incontinence: Trial with Enablex. Call if no better, watch for side effects. RTC 3 months if urinary sx no better otherwise 1 year

## 2016-02-27 NOTE — Patient Instructions (Signed)
GO TO THE LAB : Get the blood work     GO TO THE FRONT DESK Schedule your next appointment for a checkup in 3 months   Consider a healthcare power of attorney    Fall Prevention and Home Safety Falls cause injuries and can affect all age groups. It is possible to use preventive measures to significantly decrease the likelihood of falls. There are many simple measures which can make your home safer and prevent falls. OUTDOORS  Repair cracks and edges of walkways and driveways.  Remove high doorway thresholds.  Trim shrubbery on the main path into your home.  Have good outside lighting.  Clear walkways of tools, rocks, debris, and clutter.  Check that handrails are not broken and are securely fastened. Both sides of steps should have handrails.  Have leaves, snow, and ice cleared regularly.  Use sand or salt on walkways during winter months.  In the garage, clean up grease or oil spills. BATHROOM  Install night lights.  Install grab bars by the toilet and in the tub and shower.  Use non-skid mats or decals in the tub or shower.  Place a plastic non-slip stool in the shower to sit on, if needed.  Keep floors dry and clean up all water on the floor immediately.  Remove soap buildup in the tub or shower on a regular basis.  Secure bath mats with non-slip, double-sided rug tape.  Remove throw rugs and tripping hazards from the floors. BEDROOMS  Install night lights.  Make sure a bedside light is easy to reach.  Do not use oversized bedding.  Keep a telephone by your bedside.  Have a firm chair with side arms to use for getting dressed.  Remove throw rugs and tripping hazards from the floor. KITCHEN  Keep handles on pots and pans turned toward the center of the stove. Use back burners when possible.  Clean up spills quickly and allow time for drying.  Avoid walking on wet floors.  Avoid hot utensils and knives.  Position shelves so they are not too high  or low.  Place commonly used objects within easy reach.  If necessary, use a sturdy step stool with a grab bar when reaching.  Keep electrical cables out of the way.  Do not use floor polish or wax that makes floors slippery. If you must use wax, use non-skid floor wax.  Remove throw rugs and tripping hazards from the floor. STAIRWAYS  Never leave objects on stairs.  Place handrails on both sides of stairways and use them. Fix any loose handrails. Make sure handrails on both sides of the stairways are as long as the stairs.  Check carpeting to make sure it is firmly attached along stairs. Make repairs to worn or loose carpet promptly.  Avoid placing throw rugs at the top or bottom of stairways, or properly secure the rug with carpet tape to prevent slippage. Get rid of throw rugs, if possible.  Have an electrician put in a light switch at the top and bottom of the stairs. OTHER FALL PREVENTION TIPS  Wear low-heel or rubber-soled shoes that are supportive and fit well. Wear closed toe shoes.  When using a stepladder, make sure it is fully opened and both spreaders are firmly locked. Do not climb a closed stepladder.  Add color or contrast paint or tape to grab bars and handrails in your home. Place contrasting color strips on first and last steps.  Learn and use mobility aids as needed.  Install an electrical emergency response system.  Turn on lights to avoid dark areas. Replace light bulbs that burn out immediately. Get light switches that glow.  Arrange furniture to create clear pathways. Keep furniture in the same place.  Firmly attach carpet with non-skid or double-sided tape.  Eliminate uneven floor surfaces.  Select a carpet pattern that does not visually hide the edge of steps.  Be aware of all pets. OTHER HOME SAFETY TIPS  Set the water temperature for 120 F (48.8 C).  Keep emergency numbers on or near the telephone.  Keep smoke detectors on every level of  the home and near sleeping areas. Document Released: 03/15/2002 Document Revised: 09/24/2011 Document Reviewed: 06/14/2011 [facility] Patient Information 2015 [place], [place]. This information is not intended to replace advice given to you by your health care provider. Make sure you discuss any questions you have with your health care provider.   Preventive Care for Adults Ages 60 and over  Blood pressure check.** / Every 1 to 2 years.  Lipid and cholesterol check.**/ Every 5 years beginning at age 33.  Lung cancer screening. / Every year if you are aged 67-80 years and have a 30-pack-year history of smoking and currently smoke or have quit within the past 15 years. Yearly screening is stopped once you have quit smoking for at least 15 years or develop a health problem that would prevent you from having lung cancer treatment.  Fecal occult blood test (FOBT) of stool. / Every year beginning at age 23 and continuing until age 62. You may not have to do this test if you get a colonoscopy every 10 years.  Flexible sigmoidoscopy** or colonoscopy.** / Every 5 years for a flexible sigmoidoscopy or every 10 years for a colonoscopy beginning at age 64 and continuing until age 53.  Hepatitis C blood test.** / For all people born from [birth year] through 1965 and any individual with known risks for hepatitis C.  Abdominal aortic aneurysm (AAA) screening.** / A one-time screening for ages 72 to 34 years who are current or former smokers.  Skin self-exam. / Monthly.  Influenza vaccine. / Every year.  Tetanus, diphtheria, and acellular pertussis (Tdap/Td) vaccine.** / 1 dose of Td every 10 years.  Varicella vaccine.** / Consult your health care provider.  Zoster vaccine.** / 1 dose for adults aged 31 years or older.  Pneumococcal 13-valent conjugate (PCV13) vaccine.** / Consult your health care provider.  Pneumococcal polysaccharide (PPSV23) vaccine.** / 1 dose for all adults aged 34 years and  older.  Meningococcal vaccine.** / Consult your health care provider.  Hepatitis A vaccine.** / Consult your health care provider.  Hepatitis B vaccine.** / Consult your health care provider.  Haemophilus influenzae type b (Hib) vaccine.** / Consult your health care provider. **Family history and personal history of risk and conditions may change your health care provider's recommendations. Document Released: 05/21/2001 Document Revised: 03/30/2013 Document Reviewed: 08/20/2010 [facility] Patient Information 2015 [place], [place]. This information is not intended to replace advice given to you by your health care provider. Make sure you discuss any questions you have with your health care provider.

## 2016-02-27 NOTE — Progress Notes (Signed)
Pre visit review using our clinic review tool, if applicable. No additional management support is needed unless otherwise documented below in the visit note. 

## 2016-02-28 ENCOUNTER — Encounter: Payer: Self-pay | Admitting: Internal Medicine

## 2016-02-28 DIAGNOSIS — E041 Nontoxic single thyroid nodule: Secondary | ICD-10-CM

## 2016-02-28 NOTE — Assessment & Plan Note (Signed)
Hyperlipidemia: On Crestor, check a FLP, AST, ALT Depression:   controlled Thyroid cyst (per U/S): Exam today is stable compared to last year, will get a follow-up ultrasound and TSH. Urgency incontinence: Trial with Enablex. Call if no better, watch for side effects. RTC 3 months if urinary sx no better otherwise 1 year

## 2016-03-01 LAB — VITAMIN D 1,25 DIHYDROXY
Vitamin D 1, 25 (OH)2 Total: 46 pg/mL (ref 18–72)
Vitamin D2 1, 25 (OH)2: <8 pg/mL
Vitamin D3 1, 25 (OH)2: 46 pg/mL

## 2016-03-04 ENCOUNTER — Telehealth: Payer: Self-pay

## 2016-03-04 NOTE — Telephone Encounter (Signed)
PA initiated via Covermymeds; KEY: [id number]. Awaiting determination.

## 2016-03-06 ENCOUNTER — Ambulatory Visit (HOSPITAL_BASED_OUTPATIENT_CLINIC_OR_DEPARTMENT_OTHER): Payer: Commercial Managed Care - HMO

## 2016-03-07 ENCOUNTER — Encounter: Payer: Self-pay | Admitting: Internal Medicine

## 2016-03-08 DIAGNOSIS — E041 Nontoxic single thyroid nodule: Secondary | ICD-10-CM | POA: Diagnosis not present

## 2016-03-08 NOTE — Telephone Encounter (Signed)
Received PA denial. Awaiting PA determination notification letter.

## 2016-03-11 ENCOUNTER — Other Ambulatory Visit: Payer: Self-pay | Admitting: Internal Medicine

## 2016-03-11 NOTE — Telephone Encounter (Signed)
Received electronic PA denial. Awaiting PA notification letter. Pt informed via MyChart.

## 2016-03-12 MED ORDER — TOLTERODINE TARTRATE 2 MG PO TABS: 2.0000 mg | ORAL_TABLET | Freq: Two times a day (BID) | ORAL | 3 refills | Status: DC

## 2016-03-12 NOTE — Telephone Encounter (Signed)
Send a prescription for Detrol 2 mg one by mouth twice a day #60, 3 refills  Received: Today  [place], [place]  Damita Dunnings, CMA   Rx sent.

## 2016-03-21 ENCOUNTER — Telehealth: Payer: Self-pay | Admitting: Internal Medicine

## 2016-03-21 NOTE — Telephone Encounter (Signed)
MyChart message sent to Pt.

## 2016-03-21 NOTE — Telephone Encounter (Signed)
Thyroid ultrasound 03/08/2016 at [facility], compared to previous ultrasound: Bilateral thyroid cyst, stable. Do not meet criteria for BX or dedicated follow-up.  Advise patient: Ultrasound is stable, no further routine testing.

## 2016-04-03 ENCOUNTER — Encounter: Payer: Self-pay | Admitting: Neurology

## 2016-04-03 ENCOUNTER — Encounter: Payer: Self-pay | Admitting: Internal Medicine

## 2016-05-08 ENCOUNTER — Other Ambulatory Visit: Payer: Self-pay

## 2016-05-08 ENCOUNTER — Ambulatory Visit (HOSPITAL_BASED_OUTPATIENT_CLINIC_OR_DEPARTMENT_OTHER)
Admission: RE | Admit: 2016-05-08 | Discharge: 2016-05-08 | Disposition: A | Discharge status: Home / Self Care | Payer: Medicare HMO | Source: Ambulatory Visit | Attending: Internal Medicine | Admitting: Internal Medicine

## 2016-05-08 ENCOUNTER — Encounter: Payer: Self-pay | Admitting: Internal Medicine

## 2016-05-08 ENCOUNTER — Ambulatory Visit (INDEPENDENT_AMBULATORY_CARE_PROVIDER_SITE_OTHER): Payer: Medicare HMO | Admitting: Internal Medicine

## 2016-05-08 ENCOUNTER — Telehealth: Payer: Self-pay | Admitting: Internal Medicine

## 2016-05-08 VITALS — BP 126/78 | HR 70 | Temp 97.7°F | Resp 14 | Ht 65.0 in | Wt 192.1 lb

## 2016-05-08 DIAGNOSIS — R1013 Epigastric pain: Secondary | ICD-10-CM | POA: Diagnosis not present

## 2016-05-08 DIAGNOSIS — R109 Unspecified abdominal pain: Secondary | ICD-10-CM | POA: Diagnosis not present

## 2016-05-08 LAB — CBC WITH DIFFERENTIAL/PLATELET
BASOS ABS: 0 10*3/uL (ref 0.0–0.1)
Basophils Relative: 0.8 % (ref 0.0–3.0)
Eosinophils Absolute: 0.1 10*3/uL (ref 0.0–0.7)
Eosinophils Relative: 1.7 % (ref 0.0–5.0)
HCT: 40.3 % (ref 36.0–46.0)
Hemoglobin: 13.3 g/dL (ref 12.0–15.0)
LYMPHS ABS: 1.6 10*3/uL (ref 0.7–4.0)
Lymphocytes Relative: 33.2 % (ref 12.0–46.0)
MCHC: 33.1 g/dL (ref 30.0–36.0)
MCV: 91.9 fl (ref 78.0–100.0)
MONO ABS: 0.4 10*3/uL (ref 0.1–1.0)
Monocytes Relative: 8.5 % (ref 3.0–12.0)
NEUTROS ABS: 2.7 10*3/uL (ref 1.4–7.7)
NEUTROS PCT: 55.8 % (ref 43.0–77.0)
PLATELETS: 233 10*3/uL (ref 150.0–400.0)
RBC: 4.39 Mil/uL (ref 3.87–5.11)
RDW: 13.2 % (ref 11.5–15.5)
WBC: 4.8 10*3/uL (ref 4.0–10.5)

## 2016-05-08 LAB — COMPREHENSIVE METABOLIC PANEL
ALT: 15 U/L (ref 0–35)
AST: 15 U/L (ref 0–37)
Albumin: 4.5 g/dL (ref 3.5–5.2)
Alkaline Phosphatase: 75 U/L (ref 39–117)
BUN: 16 mg/dL (ref 6–23)
CO2: 32 mEq/L (ref 19–32)
Calcium: 10.1 mg/dL (ref 8.4–10.5)
Chloride: 105 mEq/L (ref 96–112)
Creatinine, Ser: 0.79 mg/dL (ref 0.40–1.20)
GFR: 75.75 mL/min (ref >60.00)
Glucose, Bld: 130 mg/dL — ABNORMAL HIGH (ref 70–99)
Potassium: 4.3 mEq/L (ref 3.5–5.1)
Sodium: 142 mEq/L (ref 135–145)
Total Bilirubin: 0.3 mg/dL (ref 0.2–1.2)
Total Protein: 7.8 g/dL (ref 6.0–8.3)

## 2016-05-08 LAB — URINALYSIS, ROUTINE W REFLEX MICROSCOPIC
Bilirubin Urine: NEGATIVE
Hgb urine dipstick: NEGATIVE
KETONES UR: NEGATIVE
Leukocytes, UA: NEGATIVE
Nitrite: NEGATIVE
PH: 5.5 (ref 5.0–8.0)
RBC / HPF: NONE SEEN (ref 0–?)
SPECIFIC GRAVITY, URINE: 1.02 (ref 1.000–1.030)
Total Protein, Urine: NEGATIVE
UROBILINOGEN UA: 0.2 (ref 0.0–1.0)
Urine Glucose: NEGATIVE
WBC UA: NONE SEEN (ref 0–?)

## 2016-05-08 LAB — AMYLASE: AMYLASE: 33 U/L (ref 27–131)

## 2016-05-08 LAB — LIPASE: Lipase: 39 U/L (ref 11.0–59.0)

## 2016-05-08 NOTE — Progress Notes (Signed)
Pre visit review using our clinic review tool, if applicable. No additional management support is needed unless otherwise documented below in the visit note. 

## 2016-05-08 NOTE — Telephone Encounter (Signed)
Dr. Larose Kells has 1:30 cancellation today, other than that no appts.

## 2016-05-08 NOTE — Patient Instructions (Signed)
GO TO THE LAB : Get the blood work    Go back on her regular medications. Is very important to go back on omeprazole, take it in the morning before breakfast  Go to the ER if you have fever, chills, severe pain.

## 2016-05-08 NOTE — Telephone Encounter (Signed)
Noted  

## 2016-05-08 NOTE — Progress Notes (Signed)
Subjective:    Patient ID: Joanne Edwards, female    DOB: 1940-07-22, 76 y.o.   MRN: [id number]  DOS:  05/08/2016 Type of visit - description : Acute visit Interval history: Symptoms started 03/17/2016 acutely during lunch: moderate to severe  epigastric pain, "like a rock in there", no burning. The next few days she fell nauseous without vomiting. Pain was essentially steady and worse when she eat. Pain does not radiate to her back  After the initial 10 days, pain become on-off, postprandial pain, may last one or 2 hours, she is eating very light to prevent severe pain. She can eat oatmeal, drink tea and  water but is avoiding things like butter which makes symptoms worse. Not taking NSAIDs Has not taken any medications specifically for the pain.    Wt Readings from Last 3 Encounters:  05/08/16 192 lb 2 oz (87.1 kg)  02/27/16 194 lb 8 oz (88.2 kg)  02/08/16 195 lb (88.5 kg)     Review of Systems Denies fevers, occasionally feels chilly. No diarrhea or blood in the stools. No heartburn except for the last 2 days. No dysphagia or odynophagia Had constipation for a few days but that is largely resolved. No dysuria or gross hematuria. Mild weight loss  Past Medical History:  Diagnosis Date  . Arthritis   . Balance problem    going down steps  . Depression    lost a son 2011 (aneurysm)  . Hyperlipemia   . Polio    as a child--loss muscle of lower left leg    Past Surgical History:  Procedure Laterality Date  . BREAST CYST EXCISION    . BREAST SURGERY     reduction  . EYE SURGERY     "fat removed from under R eye"  . OOPHORECTOMY Bilateral    was removed b/c mother hx of ovarian cancer  . REPLACEMENT TOTAL KNEE  2006   right knee  . TONSILLECTOMY    . tummy tuck  1997    Social History   Social History  . Marital status: Married    Spouse name: N/A  . Number of children: 2  . Years of education: N/A   Occupational History  . Retired Therapist, sports    Social History Main  Topics  . Smoking status: Never Smoker  . Smokeless tobacco: Never Used  . Alcohol use 0.0 oz/week     Comment: rare  . Drug use: No  . Sexual activity: Yes   Other Topics Concern  . Not on file   Social History Narrative   Household- pt and husband (33 years)   Lost 1 of her 2 sons (2011)      Allergies as of 05/08/2016   No Known Allergies     Medication List       Accurate as of 05/08/16  7:05 PM. Always use your most recent med list.          clonazePAM 0.5 MG tablet Commonly known as:  KLONOPIN Take 1 tablet (0.5 mg total) by mouth as needed.   multivitamin with minerals tablet Take 1 tablet by mouth daily.   omeprazole 20 MG capsule Commonly known as:  PRILOSEC Take 1 capsule (20 mg total) by mouth 2 (two) times daily before a meal.   rosuvastatin 20 MG tablet Commonly known as:  CRESTOR Take 1 tablet (20 mg total) by mouth at bedtime.   sertraline 100 MG tablet Commonly known as:  ZOLOFT Take 1 to  1 1/2 tablets as needed daily.   tolterodine 2 MG tablet Commonly known as:  DETROL Take 1 tablet (2 mg total) by mouth 2 (two) times daily.   topiramate 50 MG tablet Commonly known as:  TOPAMAX Take 1 tablet (50 mg total) by mouth at bedtime.   Vitamin D3 1000 units Caps Take 1 capsule by mouth daily.          Objective:   Physical Exam BP 126/78 (BP Location: Left Arm, Patient Position: Sitting, Cuff Size: Normal)   Pulse 70   Temp 97.7 F (36.5 C) (Oral)   Resp 14   Ht 5\' 5"  (1.651 m)   Wt 192 lb 2 oz (87.1 kg)   SpO2 97%   BMI 31.97 kg/m  General:   Well developed, well nourished . NAD.  HEENT:  Normocephalic . Face symmetric, atraumatic. Not pale Lungs:  CTA B Normal respiratory effort, no intercostal retractions, no accessory muscle use. Heart: RRR,  no murmur.  no pretibial edema bilaterally  Abdomen:  Not distended, soft, slightly tender at the epigastric area, no mass, rebound or rigidity. Bowel sounds normal. Skin: Not  pale. Not jaundice Neurologic:  alert & oriented X3.  Speech normal, gait appropriate for age and unassisted Psych--  Cognition and judgment appear intact.  Cooperative with normal attention span and concentration.  Behavior appropriate. No anxious or depressed appearing.    Assessment & Plan:   Assessment  Hyperlipidemia -- changed pravachol to crestor 02-2016  DJD Depression, lost a son 2011 (dt aneurysm) Migraines (sees Dr Tomi Likens  Polio, decreased muscle mass left leg Gait imbalance Varicose veins +FH Ovarian ca, status post bilateral oophorectomy Thyroid cyst, complex per ----> [place] 02-2015: + complex cysts, no criteria for bx   PLAN  Epigastric abdominal pain: 6 weeks history of acute onset epigastric pain, worse postprandial. Giving history, suspect gallbladder, DDX includes gastritis, PUD, pancreatitis versus others. Plan: CMP, CBC, amylase, lipase, UA for completeness. Get ultrasound Restart her medications including PPIs x. ER if symptoms severe. If  W/u (-) will pursue further eval

## 2016-05-08 NOTE — Telephone Encounter (Signed)
Call  [phone] patient is saying her indigestion is so bad it she cannot keep anything down but soup. It keeps her up at night gave patient an appointment for tomorrow but requested I asked you to work her in today. Please advise

## 2016-05-08 NOTE — Assessment & Plan Note (Signed)
Epigastric abdominal pain: 6 weeks history of acute onset epigastric pain, worse postprandial. Giving history, suspect gallbladder, DDX includes gastritis, PUD, pancreatitis versus others. Plan: CMP, CBC, amylase, lipase, UA for completeness. Get ultrasound Restart her medications including PPIs x. ER if symptoms severe. If  W/u (-) will pursue further eval

## 2016-05-08 NOTE — Telephone Encounter (Signed)
Patient has been scheduled

## 2016-05-09 ENCOUNTER — Ambulatory Visit: Payer: Commercial Managed Care - HMO | Admitting: Internal Medicine

## 2016-05-09 ENCOUNTER — Ambulatory Visit (HOSPITAL_BASED_OUTPATIENT_CLINIC_OR_DEPARTMENT_OTHER)
Admission: RE | Admit: 2016-05-09 | Discharge: 2016-05-09 | Disposition: A | Discharge status: Home / Self Care | Payer: Medicare HMO | Source: Ambulatory Visit | Attending: Internal Medicine | Admitting: Internal Medicine

## 2016-05-09 DIAGNOSIS — K5730 Diverticulosis of large intestine without perforation or abscess without bleeding: Secondary | ICD-10-CM | POA: Diagnosis not present

## 2016-05-09 DIAGNOSIS — R109 Unspecified abdominal pain: Secondary | ICD-10-CM | POA: Diagnosis not present

## 2016-05-09 DIAGNOSIS — R1013 Epigastric pain: Secondary | ICD-10-CM | POA: Insufficient documentation

## 2016-05-09 MED ORDER — IOPAMIDOL (ISOVUE-300) INJECTION 61%
100.0000 mL | Freq: Once | INTRAVENOUS | Status: AC | PRN
  Administered 2016-05-09: 100 mL via INTRAVENOUS

## 2016-05-09 NOTE — Addendum Note (Signed)
Addended byDamita Dunnings D on: 05/09/2016 09:09 AM   Modules accepted: Orders

## 2016-05-22 ENCOUNTER — Ambulatory Visit (INDEPENDENT_AMBULATORY_CARE_PROVIDER_SITE_OTHER): Payer: Medicare HMO | Admitting: Gastroenterology

## 2016-05-22 ENCOUNTER — Encounter: Payer: Self-pay | Admitting: Gastroenterology

## 2016-05-22 VITALS — BP 124/78 | HR 72 | Ht 65.0 in | Wt 190.0 lb

## 2016-05-22 DIAGNOSIS — G8929 Other chronic pain: Secondary | ICD-10-CM

## 2016-05-22 DIAGNOSIS — R634 Abnormal weight loss: Secondary | ICD-10-CM

## 2016-05-22 DIAGNOSIS — R1013 Epigastric pain: Secondary | ICD-10-CM | POA: Diagnosis not present

## 2016-05-22 NOTE — Progress Notes (Signed)
[facility] Consult Note:  History: Joanne Edwards 05/22/2016  Referring physician: Kathlene November, MD  Reason for consult/chief complaint: Abdominal Pain (onset 03/2016, felt like "metal" in her mouth,  no nausea, no burning, took probiotics that seemed to help, "can't eat", not [place]) and Weight Loss (down 10lbs since December)   Subjective  HPI:  This is a 76 year old woman last seen by Dr. Deatra Ina for screening colonoscopy in July 2014. She is here for about 2 months of abdominal pain and weight loss. She recalls the exact day in early December when she had the immediate onset of a lower chest and epigastric discomfort while eating at a restaurant. She felt like "something just didn't sit right, and I thought a person may have had dirty hands in the kitchen". She felt what sounds like some regurgitation and a bitter taste in her throat. She then had some abdominal bloating and felt like for about the next 10 days she could eat very little, felt tired and was in bed most of the time. She seemed to improve for several days, but then around the new year had a recurrence of symptoms for several days. Symptoms have continued since then, where most food just "feels that he does not sit right" which she seems to mean a heaviness in the epigastrium and food has lost all its taste. There is no dysphagia, vomiting, but the food avoidance has led to about a 10 pound weight loss in the last 2 months. She has no fever night sweats change in bowel habits or rectal bleeding. She had no preceding digestive symptoms before the moment of acute onset of these symptoms.  ROS:  Review of Systems  Constitutional: Negative for appetite change and unexpected weight change.  HENT: Negative for mouth sores and voice change.   Eyes: Negative for pain and redness.  Respiratory: Negative for cough and shortness of breath.   Cardiovascular: Negative for chest pain and palpitations.  Genitourinary: Negative for  dysuria and hematuria.  Musculoskeletal: Positive for arthralgias. Negative for myalgias.  Skin: Negative for pallor and rash.  Neurological: Negative for weakness and headaches.       Preceding chronic balance issues  Hematological: Negative for adenopathy.     Past Medical History: Past Medical History:  Diagnosis Date  . Arthritis   . Balance problem    going down steps  . Depression    lost a son 2011 (aneurysm)  . Hyperlipemia   . Polio    as a child--loss muscle of lower left leg     Past Surgical History: Past Surgical History:  Procedure Laterality Date  . BREAST CYST EXCISION    . BREAST SURGERY     reduction  . EYE SURGERY     "fat removed from under R eye"  . OOPHORECTOMY Bilateral    was removed b/c mother hx of ovarian cancer  . REPLACEMENT TOTAL KNEE  2006   right knee  . TONSILLECTOMY    . tummy tuck  1997     Family History: Family History  Problem Relation Age of Onset  . Ovarian cancer Mother   . CAD Father     h/o angina dx age 2s  . Aneurysm Son   . Colon cancer Neg Hx   . Esophageal cancer Neg Hx   . Rectal cancer Neg Hx   . Stomach cancer Neg Hx   . Heart attack Neg Hx   . Breast cancer Neg Hx  Social History: Social History   Social History  . Marital status: Married    Spouse name: N/A  . Number of children: 2  . Years of education: N/A   Occupational History  . Retired Therapist, sports    Social History Main Topics  . Smoking status: Never Smoker  . Smokeless tobacco: Never Used  . Alcohol use 0.0 oz/week     Comment: rare  . Drug use: No  . Sexual activity: Yes   Other Topics Concern  . None   Social History Narrative   Household- pt and husband (43 years)   Lost 1 of her 2 sons (2011)   She has suffered from depression since the sudden loss of their son in 2011.  Allergies: No Known Allergies  Outpatient Meds: Current Outpatient Prescriptions  Medication Sig Dispense Refill  . Cholecalciferol (VITAMIN D3) 1000  UNITS CAPS Take 1 capsule by mouth daily.    Marland Kitchen omeprazole (PRILOSEC) 20 MG capsule Take 1 capsule (20 mg total) by mouth 2 (two) times daily before a meal. 180 capsule 3  . sertraline (ZOLOFT) 100 MG tablet Take 1 to 1 1/2 tablets as needed daily. 135 tablet 3  . topiramate (TOPAMAX) 50 MG tablet Take 1 tablet (50 mg total) by mouth at bedtime. 90 tablet 1  . clonazePAM (KLONOPIN) 0.5 MG tablet Take 1 tablet (0.5 mg total) by mouth as needed. (Patient not taking: Reported on 05/08/2016) 90 tablet 1  . rosuvastatin (CRESTOR) 20 MG tablet Take 1 tablet (20 mg total) by mouth at bedtime. (Patient not taking: Reported on 05/08/2016) 90 tablet 2   No current facility-administered medications for this visit.     She has not taken the Klonopin in years  ___________________________________________________________________ Objective   Exam:  BP 124/78   Pulse 72   Ht 5\' 5"  (1.651 m)   Wt 190 lb (86.2 kg)   BMI 31.62 kg/m    General: this is a(n) well-appearing elderly woman with no muscle wasting   Eyes: sclera anicteric, no redness  ENT: oral mucosa moist without lesions, no cervical or supraclavicular lymphadenopathy, good dentition  CV: RRR without murmur, S1/S2, no JVD, no peripheral edema  Resp: clear to auscultation bilaterally, normal RR and effort noted  GI: soft, no tenderness, with active bowel sounds. No guarding or palpable organomegaly noted.  Skin; warm and dry, no rash or jaundice noted  Neuro: awake, alert and oriented x 3. Normal gross motor function and fluent speech  Labs:  CMP Latest Ref Rng & Units 05/08/2016 02/27/2016 11/01/2015  Glucose 70 - 99 mg/dL 130(H) - 94  BUN 6 - 23 mg/dL 16 - 16  Creatinine 0.40 - 1.20 mg/dL 0.79 - 0.71  Sodium 135 - 145 mEq/L 142 - 139  Potassium 3.5 - 5.1 mEq/L 4.3 - 4.2  Chloride 96 - 112 mEq/L 105 - 102  CO2 19 - 32 mEq/L 32 - 31  Calcium 8.4 - 10.5 mg/dL 10.1 - 9.9  Total Protein 6.0 - 8.3 g/dL 7.8 - -  Total Bilirubin 0.2  - 1.2 mg/dL 0.3 - -  Alkaline Phos 39 - 117 U/L 75 - -  AST 0 - 37 U/L 15 18 -  ALT 0 - 35 U/L 15 20 -   CBC    Component Value Date/Time   WBC 4.8 05/08/2016 1413   RBC 4.39 05/08/2016 1413   HGB 13.3 05/08/2016 1413   HCT 40.3 05/08/2016 1413   PLT 233.0 05/08/2016 1413   MCV  91.9 05/08/2016 1413   MCHC 33.1 05/08/2016 1413   RDW 13.2 05/08/2016 1413   LYMPHSABS 1.6 05/08/2016 1413   MONOABS 0.4 05/08/2016 1413   EOSABS 0.1 05/08/2016 1413   BASOSABS 0.0 05/08/2016 1413     Radiologic Studies:  CTAP nml 05/09/16   Assessment: Encounter Diagnoses  Name Primary?  . Abdominal pain, chronic, epigastric Yes  . 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 I explained this well described phenomenon to her, and how it is almost self-limited but might last months. I think some reassurance would be of help to her. The weight loss is particularly bothersome symptom and I think warrants further investigation.  Plan:  EGD tomorrow If negative, further reassurance and consideration of as needed Klonopin. Benzodiazepines have empiric utility in this scenario.  Thank you for the courtesy of this consult.  Please call me with any questions or concerns.  Nelida Meuse III  CC: Kathlene November, MD

## 2016-05-22 NOTE — Patient Instructions (Signed)
If you are age 74 or older, your body mass index should be between 23-30. Your Body mass index is 31.62 kg/m. If this is out of the aforementioned range listed, please consider follow up with your Primary Care Provider.  If you are age 11 or younger, your body mass index should be between 19-25. Your Body mass index is 31.62 kg/m. If this is out of the aformentioned range listed, please consider follow up with your Primary Care Provider.   You have been scheduled for an endoscopy. Please follow written instructions given to you at your visit today. If you use inhalers (even only as needed), please bring them with you on the day of your procedure. Your physician has requested that you go to www.startemmi.com and enter the access code given to you at your visit today. This web site gives a general overview about your procedure. However, you should still follow specific instructions given to you by our office regarding your preparation for the procedure.  Thank you for choosing [facility]  Dr Wilfrid Lund III

## 2016-05-23 ENCOUNTER — Ambulatory Visit (AMBULATORY_SURGERY_CENTER): Payer: Medicare HMO | Admitting: Gastroenterology

## 2016-05-23 ENCOUNTER — Encounter: Payer: Self-pay | Admitting: Gastroenterology

## 2016-05-23 VITALS — BP 114/73 | HR 59 | Temp 98.4°F | Resp 12 | Ht 65.0 in | Wt 190.0 lb

## 2016-05-23 DIAGNOSIS — K319 Disease of stomach and duodenum, unspecified: Secondary | ICD-10-CM

## 2016-05-23 DIAGNOSIS — K2950 Unspecified chronic gastritis without bleeding: Secondary | ICD-10-CM | POA: Diagnosis not present

## 2016-05-23 DIAGNOSIS — R634 Abnormal weight loss: Secondary | ICD-10-CM | POA: Diagnosis not present

## 2016-05-23 DIAGNOSIS — R1013 Epigastric pain: Secondary | ICD-10-CM | POA: Diagnosis not present

## 2016-05-23 MED ORDER — SODIUM CHLORIDE 0.9 % IV SOLN: 500.0000 mL | INTRAVENOUS | Status: DC

## 2016-05-23 NOTE — Progress Notes (Signed)
Called to room to assist during endoscopic procedure.  Patient ID and intended procedure confirmed with present staff. Received instructions for my participation in the procedure from the performing physician.  

## 2016-05-23 NOTE — Progress Notes (Signed)
Patient awakening,vss,report to rn 

## 2016-05-23 NOTE — Patient Instructions (Signed)
YOU HAD AN ENDOSCOPIC PROCEDURE TODAY AT [facility]:   Refer to the procedure report that was given to you for any specific questions about what was found during the examination.  If the procedure report does not answer your questions, please call your gastroenterologist to clarify.  If you requested that your care partner not be given the details of your procedure findings, then the procedure report has been included in a sealed envelope for you to review at your convenience later.  YOU SHOULD EXPECT: Some feelings of bloating in the abdomen. Passage of more gas than usual.  Walking can help get rid of the air that was put into your GI tract during the procedure and reduce the bloating. If you had a lower endoscopy (such as a colonoscopy or flexible sigmoidoscopy) you may notice spotting of blood in your stool or on the toilet paper. If you underwent a bowel prep for your procedure, you may not have a normal bowel movement for a few days.  Please Note:  You might notice some irritation and congestion in your nose or some drainage.  This is from the oxygen used during your procedure.  There is no need for concern and it should clear up in a day or so.  SYMPTOMS TO REPORT IMMEDIATELY:    Following upper endoscopy (EGD)  Vomiting of blood or coffee ground material  New chest pain or pain under the shoulder blades  Painful or persistently difficult swallowing  New shortness of breath  Fever of 100F or higher  Black, tarry-looking stools  For urgent or emergent issues, a gastroenterologist can be reached at any hour by calling [phone].   DIET:  We do recommend a small meal at first, but then you may proceed to your regular diet.  Drink plenty of fluids but you should avoid alcoholic beverages for 24 hours.  ACTIVITY:  You should plan to take it easy for the rest of today and you should NOT DRIVE or use heavy machinery until tomorrow (because of the sedation medicines used  during the test).    FOLLOW UP: Our staff will call the number listed on your records the next business day following your procedure to check on you and address any questions or concerns that you may have regarding the information given to you following your procedure. If we do not reach you, we will leave a message.  However, if you are feeling well and you are not experiencing any problems, there is no need to return our call.  We will assume that you have returned to your regular daily activities without incident.  If any biopsies were taken you will be contacted by phone or by letter within the next 1-3 weeks.  Please call us at [phone] if you have not heard about the biopsies in 3 weeks.    SIGNATURES/CONFIDENTIALITY: You and/or your care partner have signed paperwork which will be entered into your electronic medical record.  These signatures attest to the fact that that the information above on your After Visit Summary has been reviewed and is understood.  Full responsibility of the confidentiality of this discharge information lies with you and/or your care-partner.    Handouts were given to your care partner on gastritis. You may resume your current medications today. Await biopsy results. Please call if any questions or concerns.

## 2016-05-23 NOTE — Progress Notes (Signed)
No problems noted in the recovery room. maw 

## 2016-05-23 NOTE — Op Note (Signed)
[facility] Patient Name: Joanne Edwards Procedure Date: 05/23/2016 3:14 PM MRN: [id number] Endoscopist: [place]. Loletha Carrow , MD Age: 76 Referring MD:  Date of Birth: [birth year] Gender: Female Account #: [id number] Procedure:                Upper GI endoscopy Indications:              Indigestion, Heartburn, Weight loss Medicines:                Monitored Anesthesia Care Procedure:                Pre-Anesthesia Assessment:                           - Prior to the procedure, a History and Physical                            was performed, and patient medications and                            allergies were reviewed. The patient's tolerance of                            previous anesthesia was also reviewed. The risks                            and benefits of the procedure and the sedation                            options and risks were discussed with the patient.                            All questions were answered, and informed consent                            was obtained. Prior Anticoagulants: The patient has                            taken no previous anticoagulant or antiplatelet                            agents. ASA Grade Assessment: II - A patient with                            mild systemic disease. After reviewing the risks                            and benefits, the patient was deemed in                            satisfactory condition to undergo the procedure.                           After obtaining informed consent, the endoscope was  passed under direct vision. Throughout the                            procedure, the patient's blood pressure, pulse, and                            oxygen saturations were monitored continuously. The                            Model GIF-HQ190 [id number]) scope was introduced                            through the mouth, and advanced to the second part                            of duodenum. The upper  GI endoscopy was                            accomplished without difficulty. The patient                            tolerated the procedure well. Scope In: Scope Out: Findings:                 The esophagus was normal.                           Atrophic mucosa was found in the gastric antrum.                            Biopsies were taken with a cold forceps for                            histology to rule out H. pylori infection.                           The cardia and gastric fundus were normal on                            retroflexion.                           The examined duodenum was normal. Complications:            No immediate complications. Estimated Blood Loss:     Estimated blood loss: none. Impression:               - Normal esophagus.                           - Gastric mucosal atrophy. Biopsied.                           - Normal examined duodenum.                           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 Recommendation:           - Patient has a contact number available for                            emergencies. The signs and symptoms of potential                            delayed complications were discussed with the                            patient. Return to normal activities tomorrow.                            Written discharge instructions were provided to the                            patient.                           - Resume previous diet.                           - Continue present medications.                           - Await pathology results. [name] L. Loletha Carrow, MD 05/23/2016 3:26:42 PM This report has been signed electronically.

## 2016-05-24 ENCOUNTER — Telehealth: Payer: Self-pay

## 2016-05-24 NOTE — Telephone Encounter (Signed)
  Follow up Call-  Call back number 05/23/2016  Post procedure Call Back phone  # [phone]  Permission to leave phone message Yes  Some recent data might be hidden     Patient questions:  Do you have a fever, pain , or abdominal swelling? No. Pain Score  0 *  Have you tolerated food without any problems? Yes.    Have you been able to return to your normal activities? Yes.    Do you have any questions about your discharge instructions: Diet   No. Medications  No. Follow up visit  No.  Do you have questions or concerns about your Care? No.  Actions: * If pain score is 4 or above: No action needed, pain <4.

## 2016-05-28 ENCOUNTER — Encounter: Payer: Self-pay | Admitting: Gastroenterology

## 2016-05-29 ENCOUNTER — Encounter: Payer: Self-pay | Admitting: Internal Medicine

## 2016-05-29 ENCOUNTER — Ambulatory Visit (INDEPENDENT_AMBULATORY_CARE_PROVIDER_SITE_OTHER): Payer: Medicare HMO | Admitting: Internal Medicine

## 2016-05-29 VITALS — BP 126/74 | HR 71 | Temp 98.1°F | Resp 14 | Ht 65.0 in | Wt 189.2 lb

## 2016-05-29 DIAGNOSIS — R1013 Epigastric pain: Secondary | ICD-10-CM | POA: Diagnosis not present

## 2016-05-29 NOTE — Patient Instructions (Signed)
Next visit in 6 months, please make an appointment

## 2016-05-29 NOTE — Progress Notes (Signed)
Subjective:    Patient ID: Joanne Edwards, female    DOB: 04-Oct-1940, 76 y.o.   MRN: [id number]  DOS:  05/29/2016 Type of visit - description : f/u Interval history: Workup done since the last visit reviewed. Patient started PPIs, feeling better. Abdominal pain much improved, still has to watch her diet closely as large meals and certainl foods affect her. Has noted that calcium supplements increase her symptoms.   Review of Systems Denies fever chills Occasional nausea, no vomiting, diarrhea, constipation. No blood in the stools  Past Medical History:  Diagnosis Date  . Anxiety   . Arthritis   . Balance problem    going down steps  . Depression    lost a son 2011 (aneurysm)  . GERD (gastroesophageal reflux disease)   . Hyperlipemia   . Polio    as a child--loss muscle of lower left leg    Past Surgical History:  Procedure Laterality Date  . BREAST CYST EXCISION    . BREAST SURGERY     reduction  . EYE SURGERY     "fat removed from under R eye"  . OOPHORECTOMY Bilateral    was removed b/c mother hx of ovarian cancer  . REPLACEMENT TOTAL KNEE  2006   right knee  . TONSILLECTOMY    . tummy tuck  1997    Social History   Social History  . Marital status: Married    Spouse name: N/A  . Number of children: 2  . Years of education: N/A   Occupational History  . Retired Therapist, sports    Social History Main Topics  . Smoking status: Never Smoker  . Smokeless tobacco: Never Used  . Alcohol use 0.0 oz/week     Comment: rare  . Drug use: No  . Sexual activity: Yes   Other Topics Concern  . Not on file   Social History Narrative   Household- pt and husband (59 years)   Lost 1 of her 2 sons (2011)      Allergies as of 05/29/2016   No Known Allergies     Medication List       Accurate as of 05/29/16 11:59 PM. Always use your most recent med list.          clonazePAM 0.5 MG tablet Commonly known as:  KLONOPIN Take 1 tablet (0.5 mg total) by mouth as needed.     omeprazole 20 MG capsule Commonly known as:  PRILOSEC Take 1 capsule (20 mg total) by mouth 2 (two) times daily before a meal.   rosuvastatin 20 MG tablet Commonly known as:  CRESTOR Take 1 tablet (20 mg total) by mouth at bedtime.   sertraline 100 MG tablet Commonly known as:  ZOLOFT Take 1 to 1 1/2 tablets as needed daily.   topiramate 50 MG tablet Commonly known as:  TOPAMAX Take 1 tablet (50 mg total) by mouth at bedtime.   Vitamin D3 1000 units Caps Take 1 capsule by mouth daily.          Objective:   Physical Exam BP 126/74 (BP Location: Left Arm, Patient Position: Sitting, Cuff Size: Normal)   Pulse 71   Temp 98.1 F (36.7 C) (Oral)   Resp 14   Ht 5\' 5"  (1.651 m)   Wt 189 lb 4 oz (85.8 kg)   SpO2 98%   BMI 31.49 kg/m  General:   Well developed, well nourished . NAD.  HEENT:  Normocephalic . Face symmetric, atraumatic Abdomen:  Not distended, soft, non-tender. No rebound or rigidity.  Skin: Not pale. Not jaundice Neurologic:  alert & oriented X3.  Speech normal, gait appropriate for age and unassisted Psych--  Cognition and judgment appear intact.  Cooperative with normal attention span and concentration.  Behavior appropriate. No anxious or depressed appearing.    Assessment & Plan:    Assessment  Hyperlipidemia -- changed pravachol to crestor 02-2016  DJD Depression, lost a son 2011 (dt aneurysm) Migraines (sees Dr Tomi Likens  Polio, decreased muscle mass left leg Gait imbalance Varicose veins +FH Ovarian ca, status post bilateral oophorectomy Thyroid cyst, complex per ----> [place] 02-2015: + complex cysts, no criteria for bx   PLAN  Epigastric abdominal pain: Since the last office visit the labs, abdominal ultrasound, abdomen and pelvis CT and EGD were negative. Gastric biopsy negative.  She was started on PPIs, although there was no evidence of PUD on the EGD, she is feeling better. Recommend to continue PPIs, watch her diet (see HPI), call if  symptoms resurface. RTC 6 months for a checkup

## 2016-05-29 NOTE — Progress Notes (Signed)
Pre visit review using our clinic review tool, if applicable. No additional management support is needed unless otherwise documented below in the visit note. 

## 2016-05-30 NOTE — Assessment & Plan Note (Signed)
Epigastric abdominal pain: Since the last office visit the labs, abdominal ultrasound, abdomen and pelvis CT and EGD were negative. Gastric biopsy negative.  She was started on PPIs, although there was no evidence of PUD on the EGD, she is feeling better. Recommend to continue PPIs, watch her diet (see HPI), call if symptoms resurface. RTC 6 months for a checkup

## 2016-06-29 ENCOUNTER — Encounter: Payer: Self-pay | Admitting: Internal Medicine

## 2016-07-01 ENCOUNTER — Telehealth: Payer: Self-pay | Admitting: Neurology

## 2016-07-01 MED ORDER — SERTRALINE HCL 100 MG PO TABS: ORAL_TABLET | ORAL | 3 refills | Status: DC

## 2016-07-01 NOTE — Telephone Encounter (Signed)
Patient states that she is out of refills of medication and could not giver me the name of the medication. She states we need to send it in to the [facility]

## 2016-07-02 MED ORDER — TOPIRAMATE 50 MG PO TABS: 50.0000 mg | ORAL_TABLET | Freq: Every day | ORAL | 1 refills | Status: DC

## 2016-07-02 NOTE — Telephone Encounter (Signed)
Patient called back and needed refill of Topamax 50 mg.   RX sent to [facility].

## 2016-07-03 ENCOUNTER — Encounter: Payer: Self-pay | Admitting: Internal Medicine

## 2016-07-03 ENCOUNTER — Ambulatory Visit (INDEPENDENT_AMBULATORY_CARE_PROVIDER_SITE_OTHER): Payer: Medicare HMO | Admitting: Internal Medicine

## 2016-07-03 VITALS — BP 126/74 | HR 70 | Temp 97.4°F | Resp 14 | Ht 65.0 in | Wt 187.4 lb

## 2016-07-03 DIAGNOSIS — M25551 Pain in right hip: Secondary | ICD-10-CM | POA: Diagnosis not present

## 2016-07-03 MED ORDER — CELECOXIB 100 MG PO CAPS: 100.0000 mg | ORAL_CAPSULE | Freq: Two times a day (BID) | ORAL | 0 refills | Status: DC | PRN

## 2016-07-03 NOTE — Progress Notes (Signed)
Subjective:    Patient ID: Joanne Edwards, female    DOB: [birth year], 76 y.o.   MRN: [id number]  DOS:  07/03/2016 Type of visit - description : acute Interval history: Having hip pain lately, right side, worse when she sleeps on the right side. Pain is located externally, slightly worse when she walks, some radiation to the inguinal area. Is concerned because she starting to hurt at the right knee which was replaced years ago.  Review of Systems  Denies any recent injury Was seen recently with stomach pain and is improving on PPIs.  Past Medical History:  Diagnosis Date  . Anxiety   . Arthritis   . Balance problem    going down steps  . Depression    lost a son 2011 (aneurysm)  . GERD (gastroesophageal reflux disease)   . Hyperlipemia   . Polio    as a child--loss muscle of lower left leg    Past Surgical History:  Procedure Laterality Date  . BREAST CYST EXCISION    . BREAST SURGERY     reduction  . EYE SURGERY     "fat removed from under R eye"  . OOPHORECTOMY Bilateral    was removed b/c mother hx of ovarian cancer  . REPLACEMENT TOTAL KNEE  2006   right knee  . TONSILLECTOMY    . tummy tuck  1997    Social History   Social History  . Marital status: Married    Spouse name: N/A  . Number of children: 2  . Years of education: N/A   Occupational History  . Retired Therapist, sports    Social History Main Topics  . Smoking status: Never Smoker  . Smokeless tobacco: Never Used  . Alcohol use 0.0 oz/week     Comment: rare  . Drug use: No  . Sexual activity: Yes   Other Topics Concern  . Not on file   Social History Narrative   Household- pt and husband (79 years)   Lost 1 of her 2 sons (2011)      Allergies as of 07/03/2016   No Known Allergies     Medication List       Accurate as of 07/03/16 11:59 PM. Always use your most recent med list.          celecoxib 100 MG capsule Commonly known as:  CELEBREX Take 1 capsule (100 mg total) by mouth 2 (two) times  daily as needed.   clonazePAM 0.5 MG tablet Commonly known as:  KLONOPIN Take 1 tablet (0.5 mg total) by mouth as needed.   omeprazole 20 MG capsule Commonly known as:  PRILOSEC Take 1 capsule (20 mg total) by mouth 2 (two) times daily before a meal.   rosuvastatin 20 MG tablet Commonly known as:  CRESTOR Take 1 tablet (20 mg total) by mouth at bedtime.   sertraline 100 MG tablet Commonly known as:  ZOLOFT Take 1 to 1 1/2 tablets as needed daily.   topiramate 50 MG tablet Commonly known as:  TOPAMAX Take 1 tablet (50 mg total) by mouth at bedtime.   Vitamin D3 1000 units Caps Take 1 capsule by mouth daily.          Objective:   Physical Exam BP 126/74 (BP Location: Left Arm, Patient Position: Sitting, Cuff Size: Normal)   Pulse 70   Temp 97.4 F (36.3 C) (Oral)   Resp 14   Ht 5\' 5"  (1.651 m)   Wt 187 lb 6  oz (85 kg)   SpO2 98%   BMI 31.18 kg/m  General:   Well developed, well nourished . NAD.  HEENT:  Normocephalic . Face symmetric, atraumatic MSK: Left hip: Full range of motion without pain. Trochanteric bursa normal. Right hip: Full range of motion, minimal pain with passive rotation. Mild TTP at the trochanteric bursa. Skin: Not pale. Not jaundice Neurologic:  alert & oriented X3.  Speech normal, gait at baseline, mild limping due to decrease/chronic left leg weakness. Psych--  Cognition and judgment appear intact.  Cooperative with normal attention span and concentration.  Behavior appropriate. No anxious or depressed appearing.      Assessment & Plan:   Assessment  Hyperlipidemia -- changed pravachol to crestor 02-2016  DJD Depression, lost a son 2011 (dt aneurysm) Migraines (sees Dr Tomi Likens  Polio, decreased muscle mass left leg Gait imbalance Varicose veins +FH Ovarian ca, status post bilateral oophorectomy Thyroid cyst, complex per ----> [place] 02-2015: + complex cysts, no criteria for bx   PLAN  Hip pain: Pain related likely to hip DJD  and/or trochanteric bursitis. Recommend to see ortho, in the meantime will rx  Celebrex 100 mg twice a day as needed, GI precautions discussed. She can also take Tylenol.

## 2016-07-03 NOTE — Patient Instructions (Signed)
Take celebrex 100 mg 1 tab twice a day as needed for pain.  Always take it with food because may cause gastritis and ulcers.  If you notice nausea, stomach pain, change in the color of stools --->  Stop the medicine and let us know   Tylenol  500 mg OTC 2 tabs a day every 8 hours as needed for pain

## 2016-07-03 NOTE — Progress Notes (Signed)
Pre visit review using our clinic review tool, if applicable. No additional management support is needed unless otherwise documented below in the visit note. 

## 2016-07-04 NOTE — Assessment & Plan Note (Signed)
Hip pain: Pain related likely to hip DJD and/or trochanteric bursitis. Recommend to see ortho, in the meantime will rx  Celebrex 100 mg twice a day as needed, GI precautions discussed. She can also take Tylenol.

## 2016-07-09 ENCOUNTER — Telehealth: Payer: Self-pay | Admitting: Internal Medicine

## 2016-07-09 NOTE — Telephone Encounter (Signed)
Spoke with patient regarding awv. Patient stated that she is doesn't feel this type of appt is necessary and does not want to schedule. Added note to FYI tab on patient's chart.

## 2016-07-10 ENCOUNTER — Telehealth: Payer: Self-pay | Admitting: Internal Medicine

## 2016-07-10 DIAGNOSIS — Z1231 Encounter for screening mammogram for malignant neoplasm of breast: Secondary | ICD-10-CM

## 2016-07-10 NOTE — Telephone Encounter (Signed)
Order placed

## 2016-07-10 NOTE — Telephone Encounter (Signed)
Caller name: Relationship to patient: Self Can be reached: [phone]  Pharmacy:  Reason for call: Patient sent My Chart message requesting an order for a mammogram. Plse adv

## 2016-07-23 ENCOUNTER — Encounter (HOSPITAL_BASED_OUTPATIENT_CLINIC_OR_DEPARTMENT_OTHER): Payer: Self-pay

## 2016-07-23 ENCOUNTER — Ambulatory Visit (HOSPITAL_BASED_OUTPATIENT_CLINIC_OR_DEPARTMENT_OTHER)
Admission: RE | Admit: 2016-07-23 | Discharge: 2016-07-23 | Disposition: A | Discharge status: Home / Self Care | Payer: Medicare HMO | Source: Ambulatory Visit | Attending: Internal Medicine | Admitting: Internal Medicine

## 2016-07-23 DIAGNOSIS — Z1231 Encounter for screening mammogram for malignant neoplasm of breast: Secondary | ICD-10-CM | POA: Insufficient documentation

## 2016-07-23 DIAGNOSIS — R928 Other abnormal and inconclusive findings on diagnostic imaging of breast: Secondary | ICD-10-CM | POA: Diagnosis not present

## 2016-07-24 ENCOUNTER — Ambulatory Visit (INDEPENDENT_AMBULATORY_CARE_PROVIDER_SITE_OTHER): Payer: Medicare HMO

## 2016-07-24 ENCOUNTER — Ambulatory Visit (INDEPENDENT_AMBULATORY_CARE_PROVIDER_SITE_OTHER): Payer: Medicare HMO | Admitting: Orthopaedic Surgery

## 2016-07-24 DIAGNOSIS — M25561 Pain in right knee: Secondary | ICD-10-CM | POA: Diagnosis not present

## 2016-07-24 DIAGNOSIS — M25551 Pain in right hip: Secondary | ICD-10-CM | POA: Diagnosis not present

## 2016-07-24 DIAGNOSIS — G8929 Other chronic pain: Secondary | ICD-10-CM

## 2016-07-24 DIAGNOSIS — M7061 Trochanteric bursitis, right hip: Secondary | ICD-10-CM

## 2016-07-24 DIAGNOSIS — M1611 Unilateral primary osteoarthritis, right hip: Secondary | ICD-10-CM | POA: Insufficient documentation

## 2016-07-24 MED ORDER — BUPIVACAINE HCL 0.5 % IJ SOLN
3.0000 mL | INTRAMUSCULAR | Status: AC | PRN
  Administered 2016-07-24: 3 mL via INTRA_ARTICULAR

## 2016-07-24 MED ORDER — TRIAMCINOLONE ACETONIDE 40 MG/ML IJ SUSP
80.0000 mg | INTRAMUSCULAR | Status: AC | PRN
  Administered 2016-07-24: 80 mg via INTRA_ARTICULAR

## 2016-07-24 MED ORDER — METHYLPREDNISOLONE ACETATE 40 MG/ML IJ SUSP
40.0000 mg | INTRAMUSCULAR | Status: AC | PRN
  Administered 2016-07-24: 40 mg via INTRA_ARTICULAR

## 2016-07-24 NOTE — Progress Notes (Signed)
Office Visit Note   Patient: Joanne Edwards           Date of Birth: December 12, 1940           MRN: [id number] Visit Date: 07/24/2016              Requested by: Colon Branch, MD [address] [place], [place] [address] PCP: Kathlene November, MD   Assessment & Plan: Visit Diagnoses:  1. Pain in right hip   2. Chronic pain of right knee   3. Unilateral primary osteoarthritis, right hip   4. Trochanteric bursitis, right hip     Plan: We had a long and thorough discussion about her hip. All questions were encouraged and answered. I do feel that her right total knee arthroplasty is stable. I have recommended a hip replacement on her right side when she gets the point that is bothering her enough. It sounds like it is detrimentally affecting her activities daily living, her quality of life, and her mobility. She is heading down to [place] today due to a death. I offered her a trochanteric injection and she tolerated this well over the right trochanteric area. I then consult to Dr. Ernestina Patches in the office to consider an intra-articular steroid injection her right hip which she was agreeable to. I'll see her back myself in a month. I showed her hip model and spent in detail what hip replacement surgery involves. I gave her handout on anterior hip replacement surgery as well.  Follow-Up Instructions: Return in about 4 weeks (around 08/21/2016).   Orders:  Orders Placed This Encounter  Procedures  . Large Joint Injection/Arthrocentesis  . XR HIP UNILAT W OR W/O PELVIS 1V RIGHT  . XR Knee 1-2 Views Right   No orders of the defined types were placed in this encounter.     Procedures: Large Joint Inj Date/Time: 07/24/2016 9:56 AM Performed by: Mcarthur Rossetti Authorized by: Mcarthur Rossetti   Location:  Hip Site:  R greater trochanter Ultrasound Guidance: No   Fluoroscopic Guidance: No   Arthrogram: No   Medications:  40 mg methylPREDNISolone acetate 40 MG/ML     Clinical  Data: No additional findings.   Subjective: No chief complaint on file. Patient is a very pleasant 76 year old who comes in with chief complaint of right hip pain as well as right knee pain she had a right total knee arthroplasty in 2004 and she feels like her knee pain may be more related to was going on the right hip. She's been having pain in the groin but also on the side of the hip for last few months is slowly getting worse. She does a lot of traveling and is mainly been hurting or even longer in this but certainly she is been able to stay off of it on occasion as well as take anti-inflammatories and has taken the edge off. At times it can be 10 out of 10. At times it can be a stabbing pain in her groin and is slowly getting worse. She does take Celebrex does help some. She does not have any instability symptoms of her right knee and it does not swell on her.  HPI  Review of Systems She denies any chest pain, headache, shortness of breath, fever, chills, nausea, vomiting.  Objective: Vital Signs: There were no vitals taken for this visit.  Physical Exam She does walk with a slight limp. 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 She is not using an assistive device Ortho Exam Examination of her left hip shows normal range of motion of left hip passively and actively with no pain in the groin at all. Examination of her right hip shows pain over the trochanteric area to palpation but also pain in the groin significantly with internal/external rotation of the right hip. Her right knee has good range of motion. There is no effusion of her right knee. Incisions well-healed. The knee feels ligaments stable in terms of the implant. Specialty Comments:  No specialty comments available.  Imaging: Mm Digital Screening  Result Date: 07/24/2016 CLINICAL DATA:  Screening. EXAM: DIGITAL SCREENING BILATERAL MAMMOGRAM  WITH CAD COMPARISON:  Previous exam(s). ACR Breast Density Category b: There are scattered areas of fibroglandular density. FINDINGS: In the left breast, a possible mass warrants further evaluation. In the right breast, no findings suspicious for malignancy. Images were processed with CAD. IMPRESSION: Further evaluation is suggested for possible mass in the left breast. RECOMMENDATION: 3D diagnostic mammogram and possibly ultrasound of the left breast. (Code:FI-L-70M) The patient will be contacted regarding the findings, and additional imaging will be scheduled. BI-RADS CATEGORY  0: Incomplete. Need additional imaging evaluation and/or prior mammograms for comparison. Electronically Signed   By: Evangeline Dakin M.D.   On: 07/24/2016 08:07   Xr Hip Unilat W Or W/o Pelvis 1v Right  Result Date: 07/24/2016 An AP pelvis and a lateral of her right hip shows significant arthritis of the right hip. There is joint space narrowing and sclerotic changes. There is some cystic changes as well. There is particular osteophytes. There is even some cortical irregularities around the trochanteric area of the hip.  Xr Knee 1-2 Views Right  Result Date: 07/24/2016 2 views of her right knee show well-seated total knee arthroplasty. I see no effusion. I see no significant wear of the polyethylene. There is no evidence of ostial lysis. There is no evidence of loosening. The alignment overall well maintained.    PMFS History: Patient Active Problem List   Diagnosis Date Noted  . Unilateral primary osteoarthritis, right hip 07/24/2016  . Trochanteric bursitis, right hip 07/24/2016  . Urinary incontinence 02/06/2016  . PCP NOTES >>>>>> 02/22/2015  . Thyroid cyst 02/21/2015  . Pain and swelling of left lower leg 05/17/2014  . Dysfunction of right eustachian tube 05/17/2014  . Annual physical exam 01/06/2014  . Left leg weakness 09/28/2013  . GERD (gastroesophageal reflux disease) 07/02/2013  . Hyperlipidemia  07/02/2013  . Depression with anxiety 07/02/2013  . Varicose veins of lower extremities with other complications 67/03/4579  . Special screening for malignant neoplasms, colon 10/14/2012   Past Medical History:  Diagnosis Date  . Anxiety   . Arthritis   . Balance problem    going down steps  . Depression    lost a son 2011 (aneurysm)  . GERD (gastroesophageal reflux disease)   . Hyperlipemia   . Polio    as a child--loss muscle of lower left leg    Family History  Problem Relation Age of Onset  . Ovarian cancer Mother   . CAD Father     h/o angina dx age 3s  . Aneurysm Son   . Colon cancer Neg Hx   . Esophageal cancer Neg Hx   . Rectal cancer Neg Hx   . Stomach cancer Neg Hx   . Heart attack Neg Hx   . Breast cancer Neg  Hx     Past Surgical History:  Procedure Laterality Date  . BREAST CYST EXCISION Left   . BREAST SURGERY     reduction  . EYE SURGERY     "fat removed from under R eye"  . OOPHORECTOMY Bilateral    was removed b/c mother hx of ovarian cancer  . REDUCTION MAMMAPLASTY    . REPLACEMENT TOTAL KNEE  2006   right knee  . TONSILLECTOMY    . tummy tuck  1997   Social History   Occupational History  . Retired Therapist, sports    Social History Main Topics  . Smoking status: Never Smoker  . Smokeless tobacco: Never Used  . Alcohol use 0.0 oz/week     Comment: rare  . Drug use: No  . Sexual activity: Yes

## 2016-07-24 NOTE — Progress Notes (Signed)
Joanne Edwards - 76 y.o. female MRN [id number]  Date of birth: Jul 17, 1940  Office Visit Note: Visit Date: 07/24/2016 PCP: Kathlene November, MD Referred by: Colon Branch, MD  Subjective: No chief complaint on file.  HPI: Joanne Edwards is a pleasant 76 year old female with right hip and groin pain consistent with hip referred pain. She saw Dr. Ninfa Linden today for evaluation and management he requested diagnostic and therapeutic anesthetic hip arthrogram.    ROS Otherwise per HPI.  Assessment & Plan: Visit Diagnoses:  1. Pain in right hip   2. Chronic pain of right knee   3. Unilateral primary osteoarthritis, right hip   4. Trochanteric bursitis, right hip     Plan: Findings:  Diagnostic of therapeutic right hip anesthetic arthrogram. Patient did get relief during the anesthetic phase of the injection.    Meds & Orders: No orders of the defined types were placed in this encounter.   Orders Placed This Encounter  Procedures  . Large Joint Injection/Arthrocentesis  . XR HIP UNILAT W OR W/O PELVIS 1V RIGHT  . XR Knee 1-2 Views Right    Follow-up: Return in about 4 weeks (around 08/21/2016).   Procedures: Hip anesthetic arthrogram Date/Time: 07/24/2016 10:15 AM Performed by: Magnus Sinning Authorized by: Magnus Sinning   Consent Given by:  Patient Site marked: the procedure site was marked   Timeout: prior to procedure the correct patient, procedure, and site was verified   Indications:  Pain and diagnostic evaluation Location:  Hip Site:  R hip joint Prep: patient was prepped and draped in usual sterile fashion   Needle Size:  22 G Approach:  Anterior Ultrasound Guidance: No   Fluoroscopic Guidance: No   Arthrogram: Yes   Medications:  80 mg triamcinolone acetonide 40 MG/ML; 3 mL bupivacaine 0.5 % Aspiration Attempted: Yes   Patient tolerance:  Patient tolerated the procedure well with no immediate complications  Arthrogram demonstrated excellent flow of contrast throughout the  joint surface without extravasation or obvious defect.  The patient had relief of symptoms during the anesthetic phase of the injection.     No notes on file   Clinical History: No specialty comments available.  She reports that she has never smoked. She has never used smokeless tobacco. No results for input(s): HGBA1C, LABURIC in the last 8760 hours.  Objective:  VS:  HT:    WT:   BMI:     BP:   HR: bpm  TEMP: ( )  RESP:  Physical Exam  Musculoskeletal:  Concordant right hip pain with internal rotation.    Ortho Exam Imaging: Mm Digital Screening  Result Date: 07/24/2016 CLINICAL DATA:  Screening. EXAM: DIGITAL SCREENING BILATERAL MAMMOGRAM WITH CAD COMPARISON:  Previous exam(s). ACR Breast Density Category b: There are scattered areas of fibroglandular density. FINDINGS: In the left breast, a possible mass warrants further evaluation. In the right breast, no findings suspicious for malignancy. Images were processed with CAD. IMPRESSION: Further evaluation is suggested for possible mass in the left breast. RECOMMENDATION: 3D diagnostic mammogram and possibly ultrasound of the left breast. (Code:FI-L-5M) The patient will be contacted regarding the findings, and additional imaging will be scheduled. BI-RADS CATEGORY  0: Incomplete. Need additional imaging evaluation and/or prior mammograms for comparison. Electronically Signed   By: Evangeline Dakin M.D.   On: 07/24/2016 08:07   Xr Hip Unilat W Or W/o Pelvis 1v Right  Result Date: 07/24/2016 An AP pelvis and a lateral of her right hip shows significant arthritis  of the right hip. There is joint space narrowing and sclerotic changes. There is some cystic changes as well. There is particular osteophytes. There is even some cortical irregularities around the trochanteric area of the hip.  Xr Knee 1-2 Views Right  Result Date: 07/24/2016 2 views of her right knee show well-seated total knee arthroplasty. I see no effusion. I see no  significant wear of the polyethylene. There is no evidence of ostial lysis. There is no evidence of loosening. The alignment overall well maintained.   Past Medical/Family/Surgical/Social History: Medications & Allergies reviewed per EMR Patient Active Problem List   Diagnosis Date Noted  . Unilateral primary osteoarthritis, right hip 07/24/2016  . Trochanteric bursitis, right hip 07/24/2016  . Urinary incontinence 02/06/2016  . PCP NOTES >>>>>> 02/22/2015  . Thyroid cyst 02/21/2015  . Pain and swelling of left lower leg 05/17/2014  . Dysfunction of right eustachian tube 05/17/2014  . Annual physical exam 01/06/2014  . Left leg weakness 09/28/2013  . GERD (gastroesophageal reflux disease) 07/02/2013  . Hyperlipidemia 07/02/2013  . Depression with anxiety 07/02/2013  . Varicose veins of lower extremities with other complications 11/91/4782  . Special screening for malignant neoplasms, colon 10/14/2012   Past Medical History:  Diagnosis Date  . Anxiety   . Arthritis   . Balance problem    going down steps  . Depression    lost a son 2011 (aneurysm)  . GERD (gastroesophageal reflux disease)   . Hyperlipemia   . Polio    as a child--loss muscle of lower left leg   Family History  Problem Relation Age of Onset  . Ovarian cancer Mother   . CAD Father     h/o angina dx age 67s  . Aneurysm Son   . Colon cancer Neg Hx   . Esophageal cancer Neg Hx   . Rectal cancer Neg Hx   . Stomach cancer Neg Hx   . Heart attack Neg Hx   . Breast cancer Neg Hx    Past Surgical History:  Procedure Laterality Date  . BREAST CYST EXCISION Left   . BREAST SURGERY     reduction  . EYE SURGERY     "fat removed from under R eye"  . OOPHORECTOMY Bilateral    was removed b/c mother hx of ovarian cancer  . REDUCTION MAMMAPLASTY    . REPLACEMENT TOTAL KNEE  2006   right knee  . TONSILLECTOMY    . tummy tuck  1997   Social History   Occupational History  . Retired Therapist, sports    Social History  Main Topics  . Smoking status: Never Smoker  . Smokeless tobacco: Never Used  . Alcohol use 0.0 oz/week     Comment: rare  . Drug use: No  . Sexual activity: Yes

## 2016-07-29 ENCOUNTER — Other Ambulatory Visit: Payer: Self-pay | Admitting: Internal Medicine

## 2016-07-29 DIAGNOSIS — R928 Other abnormal and inconclusive findings on diagnostic imaging of breast: Secondary | ICD-10-CM

## 2016-08-01 ENCOUNTER — Other Ambulatory Visit: Payer: Medicare HMO

## 2016-08-04 ENCOUNTER — Encounter: Payer: Self-pay | Admitting: Internal Medicine

## 2016-08-05 ENCOUNTER — Telehealth: Payer: Self-pay | Admitting: Internal Medicine

## 2016-08-05 DIAGNOSIS — N950 Postmenopausal bleeding: Secondary | ICD-10-CM

## 2016-08-05 NOTE — Telephone Encounter (Signed)
Please see message, she had a vaginal bleeding, please triage  her, ER if she sounds like needs something urgent.  Otherwise refer to gynecology with precautions to call or go to the ER if severe symptoms.

## 2016-08-05 NOTE — Telephone Encounter (Signed)
Patient reports bright red vaginal bleeding since Saturday afternoon. She is using a pad and changing it 2-3 times daily. She had abdominal pain that is 'ever so slight'-she describes the sensation as 'discomfort.' Denies cramping, dizziness, lightheadedness, and severe bleeding. She would like GYN referral. Discussed red flag symptoms that should prompt ED evaluation and patient agreed to plan.  Discussed w/ PCP, referral placed, dx post menopausal vaginal bleeding. Referral coordinators notified of need for patient to be seen ASAP. They will let PCP know if unable to get her in with gynecology this week.

## 2016-08-06 ENCOUNTER — Other Ambulatory Visit: Payer: Self-pay | Admitting: Internal Medicine

## 2016-08-06 ENCOUNTER — Ambulatory Visit
Admission: RE | Admit: 2016-08-06 | Discharge: 2016-08-06 | Disposition: A | Discharge status: Home / Self Care | Payer: Medicare HMO | Source: Ambulatory Visit | Attending: Internal Medicine | Admitting: Internal Medicine

## 2016-08-06 DIAGNOSIS — N6324 Unspecified lump in the left breast, lower inner quadrant: Secondary | ICD-10-CM | POA: Diagnosis not present

## 2016-08-06 DIAGNOSIS — R928 Other abnormal and inconclusive findings on diagnostic imaging of breast: Secondary | ICD-10-CM

## 2016-08-07 DIAGNOSIS — Z124 Encounter for screening for malignant neoplasm of cervix: Secondary | ICD-10-CM | POA: Diagnosis not present

## 2016-08-07 DIAGNOSIS — N950 Postmenopausal bleeding: Secondary | ICD-10-CM | POA: Diagnosis not present

## 2016-08-08 ENCOUNTER — Ambulatory Visit
Admission: RE | Admit: 2016-08-08 | Discharge: 2016-08-08 | Disposition: A | Discharge status: Home / Self Care | Payer: Medicare HMO | Source: Ambulatory Visit | Attending: Internal Medicine | Admitting: Internal Medicine

## 2016-08-08 ENCOUNTER — Other Ambulatory Visit: Payer: Self-pay | Admitting: Internal Medicine

## 2016-08-08 DIAGNOSIS — R928 Other abnormal and inconclusive findings on diagnostic imaging of breast: Secondary | ICD-10-CM

## 2016-08-08 DIAGNOSIS — N6012 Diffuse cystic mastopathy of left breast: Secondary | ICD-10-CM | POA: Diagnosis not present

## 2016-08-08 DIAGNOSIS — N6324 Unspecified lump in the left breast, lower inner quadrant: Secondary | ICD-10-CM | POA: Diagnosis not present

## 2016-08-14 DIAGNOSIS — N950 Postmenopausal bleeding: Secondary | ICD-10-CM | POA: Diagnosis not present

## 2016-08-21 ENCOUNTER — Ambulatory Visit (INDEPENDENT_AMBULATORY_CARE_PROVIDER_SITE_OTHER): Payer: Medicare HMO | Admitting: Physician Assistant

## 2016-08-21 DIAGNOSIS — M1611 Unilateral primary osteoarthritis, right hip: Secondary | ICD-10-CM | POA: Diagnosis not present

## 2016-08-21 NOTE — Progress Notes (Signed)
Office Visit Note   Patient: Joanne Edwards           Date of Birth: 1941/02/15           MRN: [id number] Visit Date: 08/21/2016              Requested by: Colon Branch, [address] [place], [place] [address] PCP: Colon Branch, MD   Assessment & Plan: Visit Diagnoses:  1. Unilateral primary osteoarthritis, right hip     Plan: [name] has tried conservative treatment and despite this continues to have right hip pain that is interfering with her daily activities in her activities of daily living file. Therefore she would like to proceed with a right total hip arthroplasty near future. Risks discussed with patient including infection, DVT PE and prolonged pain. She will follow with [place] 2 weeks postop. Questions are encouraged and answered by Dr. Ninfa Linden and myself today at length.  Follow-Up Instructions: Return in about 2 weeks (around 09/04/2016) for post op.   Orders:  No orders of the defined types were placed in this encounter.  No orders of the defined types were placed in this encounter.     Procedures: No procedures performed   Clinical Data: No additional findings.   Subjective: No chief complaint on file.   HPI [name] returns today follow-up of her right hip. She is status post an intra-articular articular injection by Dr. Ernestina Patches on 07/24/2016. States the injection helped really well for 2 weeks now her pain is slowly returning. She states that she would like to proceed with right total hip arthroplasty due to the fact that the pain does affect her quality of life. She notes that she really can't even to go to the grocery store whenever the pain is really bad. She has to have help from her husband to a get up and down due to the hip pain. Past medical history pertinent for history of polio as child, gastric reflux and hyperlipidemia. She denies any history of PE or DVT. Review of Systems Please see history of present illness otherwise  negative.  Objective: Vital Signs: There were no vitals taken for this visit.  Physical Exam  Constitutional: She is oriented to person, place, and time. She appears well-developed and well-nourished. No distress.  Pulmonary/Chest: Effort normal.  Neurological: She is alert and oriented to person, place, and time.  Psychiatric: She has a normal mood and affect. Her behavior is normal.    Ortho Exam  Specialty Comments:  No specialty comments available.  Imaging: No results found.   PMFS History: Patient Active Problem List   Diagnosis Date Noted  . Unilateral primary osteoarthritis, right hip 07/24/2016  . Trochanteric bursitis, right hip 07/24/2016  . Urinary incontinence 02/06/2016  . PCP NOTES >>>>>> 02/22/2015  . Thyroid cyst 02/21/2015  . Pain and swelling of left lower leg 05/17/2014  . Dysfunction of right eustachian tube 05/17/2014  . Annual physical exam 01/06/2014  . Left leg weakness 09/28/2013  . GERD (gastroesophageal reflux disease) 07/02/2013  . Hyperlipidemia 07/02/2013  . Depression with anxiety 07/02/2013  . Varicose veins of lower extremities with other complications 46/56/8127  . Special screening for malignant neoplasms, colon 10/14/2012   Past Medical History:  Diagnosis Date  . Anxiety   . Arthritis   . Balance problem    going down steps  . Depression    lost a son 2011 (aneurysm)  . GERD (gastroesophageal reflux  disease)   . Hyperlipemia   . Polio    as a child--loss muscle of lower left leg    Family History  Problem Relation Age of Onset  . Ovarian cancer Mother   . CAD Father        h/o angina dx age 82s  . Aneurysm Son   . Colon cancer Neg Hx   . Esophageal cancer Neg Hx   . Rectal cancer Neg Hx   . Stomach cancer Neg Hx   . Heart attack Neg Hx   . Breast cancer Neg Hx     Past Surgical History:  Procedure Laterality Date  . BREAST CYST EXCISION Left   . BREAST SURGERY     reduction  . EYE SURGERY     "fat removed  from under R eye"  . OOPHORECTOMY Bilateral    was removed b/c mother hx of ovarian cancer  . REDUCTION MAMMAPLASTY    . REPLACEMENT TOTAL KNEE  2006   right knee  . TONSILLECTOMY    . tummy tuck  1997   Social History   Occupational History  . Retired Therapist, sports    Social History Main Topics  . Smoking status: Never Smoker  . Smokeless tobacco: Never Used  . Alcohol use 0.0 oz/week     Comment: rare  . Drug use: No  . Sexual activity: Yes

## 2016-09-04 NOTE — Progress Notes (Signed)
Please place orders in EPIC as patient is being scheduled for a pre-op appointment! Thank you! 

## 2016-09-13 ENCOUNTER — Other Ambulatory Visit (INDEPENDENT_AMBULATORY_CARE_PROVIDER_SITE_OTHER): Payer: Self-pay | Admitting: Physician Assistant

## 2016-09-13 NOTE — Progress Notes (Signed)
Surgery on 6/22/.  Preop on 6/13.  Need orders.

## 2016-09-16 ENCOUNTER — Other Ambulatory Visit (INDEPENDENT_AMBULATORY_CARE_PROVIDER_SITE_OTHER): Payer: Self-pay | Admitting: Orthopaedic Surgery

## 2016-09-16 NOTE — Patient Instructions (Signed)
Joanne Edwards  09/16/2016   Your procedure is scheduled on: 09/27/2016    Report to [facility] Main  Entrance Take [place]  elevators to 3rd floor to  [place] at   0900 AM.     Call this number if you have problems the morning of surgery [phone]    Remember: ONLY 1 PERSON MAY GO WITH YOU TO SHORT STAY TO GET  READY MORNING OF YOUR SURGERY.  Do not eat food or drink liquids :After Midnight.     Take these medicines the morning of surgery with A SIP OF WATER: Clonazepam if needed, prilosec, Zoloft                                 You may not have any metal on your body including hair pins and              piercings  Do not wear jewelry, make-up, lotions, powders or perfumes, deodorant             Do not wear nail polish.  Do not shave  48 hours prior to surgery.                Do not bring valuables to the hospital. [place].  Contacts, dentures or bridgework may not be worn into surgery.  Leave suitcase in the car. After surgery it may be brought to your room.                     Please read over the following fact sheets you were given: _____________________________________________________________________             [facility] - Preparing for Surgery Before surgery, you can play an important role.  Because skin is not sterile, your skin needs to be as free of germs as possible.  You can reduce the number of germs on your skin by washing with CHG (chlorahexidine gluconate) soap before surgery.  CHG is an antiseptic cleaner which kills germs and bonds with the skin to continue killing germs even after washing. Please DO NOT use if you have an allergy to CHG or antibacterial soaps.  If your skin becomes reddened/irritated stop using the CHG and inform your nurse when you arrive at Short Stay. Do not shave (including legs and underarms) for at least 48 hours prior to the first CHG shower.  You may  shave your face/neck. Please follow these instructions carefully:  1.  Shower with CHG Soap the night before surgery and the  morning of Surgery.  2.  If you choose to wash your hair, wash your hair first as usual with your  normal  shampoo.  3.  After you shampoo, rinse your hair and body thoroughly to remove the  shampoo.                           4.  Use CHG as you would any other liquid soap.  You can apply chg directly  to the skin and wash                       Gently with a scrungie  or clean washcloth.  5.  Apply the CHG Soap to your body ONLY FROM THE NECK DOWN.   Do not use on face/ open                           Wound or open sores. Avoid contact with eyes, ears mouth and genitals (private parts).                       Wash face,  Genitals (private parts) with your normal soap.             6.  Wash thoroughly, paying special attention to the area where your surgery  will be performed.  7.  Thoroughly rinse your body with warm water from the neck down.  8.  DO NOT shower/wash with your normal soap after using and rinsing off  the CHG Soap.                9.  Pat yourself dry with a clean towel.            10.  Wear clean pajamas.            11.  Place clean sheets on your bed the night of your first shower and do not  sleep with pets. Day of Surgery : Do not apply any lotions/deodorants the morning of surgery.  Please wear clean clothes to the hospital/surgery center.  FAILURE TO FOLLOW THESE INSTRUCTIONS MAY RESULT IN THE CANCELLATION OF YOUR SURGERY PATIENT SIGNATURE_________________________________  NURSE SIGNATURE__________________________________  ________________________________________________________________________  WHAT IS A BLOOD TRANSFUSION? Blood Transfusion Information  A transfusion is the replacement of blood or some of its parts. Blood is made up of multiple cells which provide different functions.  Red blood cells carry oxygen and are used for blood loss  replacement.  White blood cells fight against infection.  Platelets control bleeding.  Plasma helps clot blood.  Other blood products are available for specialized needs, such as hemophilia or other clotting disorders. BEFORE THE TRANSFUSION  Who gives blood for transfusions?   Healthy volunteers who are fully evaluated to make sure their blood is safe. This is blood bank blood. Transfusion therapy is the safest it has ever been in the practice of medicine. Before blood is taken from a donor, a complete history is taken to make sure that person has no history of diseases nor engages in risky social behavior (examples are intravenous drug use or sexual activity with multiple partners). The donor's travel history is screened to minimize risk of transmitting infections, such as malaria. The donated blood is tested for signs of infectious diseases, such as HIV and hepatitis. The blood is then tested to be sure it is compatible with you in order to minimize the chance of a transfusion reaction. If you or a relative donates blood, this is often done in anticipation of surgery and is not appropriate for emergency situations. It takes many days to process the donated blood. RISKS AND COMPLICATIONS Although transfusion therapy is very safe and saves many lives, the main dangers of transfusion include:   Getting an infectious disease.  Developing a transfusion reaction. This is an allergic reaction to something in the blood you were given. Every precaution is taken to prevent this. The decision to have a blood transfusion has been considered carefully by your caregiver before blood is given. Blood is not given unless the benefits outweigh the risks. AFTER THE  TRANSFUSION  Right after receiving a blood transfusion, you will usually feel much better and more energetic. This is especially true if your red blood cells have gotten low (anemic). The transfusion raises the level of the red blood cells which  carry oxygen, and this usually causes an energy increase.  The nurse administering the transfusion will monitor you carefully for complications. HOME CARE INSTRUCTIONS  No special instructions are needed after a transfusion. You may find your energy is better. Speak with your caregiver about any limitations on activity for underlying diseases you may have. SEEK MEDICAL CARE IF:   Your condition is not improving after your transfusion.  You develop redness or irritation at the intravenous (IV) site. SEEK IMMEDIATE MEDICAL CARE IF:  Any of the following symptoms occur over the next 12 hours:  Shaking chills.  You have a temperature by mouth above 102 F (38.9 C), not controlled by medicine.  Chest, back, or muscle pain.  People around you feel you are not acting correctly or are confused.  Shortness of breath or difficulty breathing.  Dizziness and fainting.  You get a rash or develop hives.  You have a decrease in urine output.  Your urine turns a dark color or changes to pink, red, or brown. Any of the following symptoms occur over the next 10 days:  You have a temperature by mouth above 102 F (38.9 C), not controlled by medicine.  Shortness of breath.  Weakness after normal activity.  The white part of the eye turns yellow (jaundice).  You have a decrease in the amount of urine or are urinating less often.  Your urine turns a dark color or changes to pink, red, or brown. Document Released: 03/22/2000 Document Revised: 06/17/2011 Document Reviewed: 11/09/2007 ExitCare Patient Information 2014 [place].  _______________________________________________________________________  Incentive Spirometer  An incentive spirometer is a tool that can help keep your lungs clear and active. This tool measures how well you are filling your lungs with each breath. Taking long deep breaths may help reverse or decrease the chance of developing breathing (pulmonary) problems  (especially infection) following:  A long period of time when you are unable to move or be active. BEFORE THE PROCEDURE   If the spirometer includes an indicator to show your best effort, your nurse or respiratory therapist will set it to a desired goal.  If possible, sit up straight or lean slightly forward. Try not to slouch.  Hold the incentive spirometer in an upright position. INSTRUCTIONS FOR USE  1. Sit on the edge of your bed if possible, or sit up as far as you can in bed or on a chair. 2. Hold the incentive spirometer in an upright position. 3. Breathe out normally. 4. Place the mouthpiece in your mouth and seal your lips tightly around it. 5. Breathe in slowly and as deeply as possible, raising the piston or the ball toward the top of the column. 6. Hold your breath for 3-5 seconds or for as long as possible. Allow the piston or ball to fall to the bottom of the column. 7. Remove the mouthpiece from your mouth and breathe out normally. 8. Rest for a few seconds and repeat Steps 1 through 7 at least 10 times every 1-2 hours when you are awake. Take your time and take a few normal breaths between deep breaths. 9. The spirometer may include an indicator to show your best effort. Use the indicator as a goal to work toward during each repetition.  10. After each set of 10 deep breaths, practice coughing to be sure your lungs are clear. If you have an incision (the cut made at the time of surgery), support your incision when coughing by placing a pillow or rolled up towels firmly against it. Once you are able to get out of bed, walk around indoors and cough well. You may stop using the incentive spirometer when instructed by your caregiver.  RISKS AND COMPLICATIONS  Take your time so you do not get dizzy or light-headed.  If you are in pain, you may need to take or ask for pain medication before doing incentive spirometry. It is harder to take a deep breath if you are having  pain. AFTER USE  Rest and breathe slowly and easily.  It can be helpful to keep track of a log of your progress. Your caregiver can provide you with a simple table to help with this. If you are using the spirometer at home, follow these instructions: [place] IF:   You are having difficultly using the spirometer.  You have trouble using the spirometer as often as instructed.  Your pain medication is not giving enough relief while using the spirometer.  You develop fever of 100.5 F (38.1 C) or higher. SEEK IMMEDIATE MEDICAL CARE IF:   You cough up bloody sputum that had not been present before.  You develop fever of 102 F (38.9 C) or greater.  You develop worsening pain at or near the incision site. MAKE SURE YOU:   Understand these instructions.  Will watch your condition.  Will get help right away if you are not doing well or get worse. Document Released: 08/05/2006 Document Revised: 06/17/2011 Document Reviewed: 10/06/2006 [facility] Patient Information 2014 [place], [place].   ________________________________________________________________________

## 2016-09-18 ENCOUNTER — Encounter (HOSPITAL_COMMUNITY): Payer: Self-pay | Admitting: *Deleted

## 2016-09-18 ENCOUNTER — Encounter (HOSPITAL_COMMUNITY)
Admission: RE | Admit: 2016-09-18 | Discharge: 2016-09-18 | Disposition: A | Discharge status: Home / Self Care | Payer: Medicare HMO | Source: Ambulatory Visit | Attending: Orthopaedic Surgery | Admitting: Orthopaedic Surgery

## 2016-09-18 DIAGNOSIS — Z01818 Encounter for other preprocedural examination: Secondary | ICD-10-CM | POA: Insufficient documentation

## 2016-09-18 DIAGNOSIS — M1611 Unilateral primary osteoarthritis, right hip: Secondary | ICD-10-CM | POA: Insufficient documentation

## 2016-09-18 LAB — CBC
HEMATOCRIT: 39 % (ref 36.0–46.0)
Hemoglobin: 12.8 g/dL (ref 12.0–15.0)
MCH: 30.7 pg (ref 26.0–34.0)
MCHC: 32.8 g/dL (ref 30.0–36.0)
MCV: 93.5 fL (ref 78.0–100.0)
PLATELETS: 203 10*3/uL (ref 150–400)
RBC: 4.17 MIL/uL (ref 3.87–5.11)
RDW: 13.3 % (ref 11.5–15.5)
WBC: 4.2 10*3/uL (ref 4.0–10.5)

## 2016-09-18 LAB — ABO/RH: ABO/RH(D): A POS

## 2016-09-18 LAB — SURGICAL PCR SCREEN
MRSA, PCR: NEGATIVE
STAPHYLOCOCCUS AUREUS: POSITIVE — AB

## 2016-09-27 ENCOUNTER — Inpatient Hospital Stay (HOSPITAL_COMMUNITY)
Admission: RE | Admit: 2016-09-27 | Discharge: 2016-09-29 | DRG: 470 | Disposition: A | Discharge status: Home / Self Care | Payer: Medicare HMO | Source: Ambulatory Visit | Attending: Orthopaedic Surgery | Admitting: Orthopaedic Surgery

## 2016-09-27 ENCOUNTER — Inpatient Hospital Stay (HOSPITAL_COMMUNITY): Payer: Medicare HMO | Admitting: Registered Nurse

## 2016-09-27 ENCOUNTER — Encounter (HOSPITAL_COMMUNITY)
Admission: RE | Disposition: A | Discharge status: Home / Self Care | Payer: Self-pay | Source: Ambulatory Visit | Attending: Orthopaedic Surgery

## 2016-09-27 ENCOUNTER — Inpatient Hospital Stay (HOSPITAL_COMMUNITY): Payer: Medicare HMO

## 2016-09-27 ENCOUNTER — Encounter (HOSPITAL_COMMUNITY): Payer: Self-pay | Admitting: *Deleted

## 2016-09-27 DIAGNOSIS — M25551 Pain in right hip: Secondary | ICD-10-CM

## 2016-09-27 DIAGNOSIS — Z96641 Presence of right artificial hip joint: Secondary | ICD-10-CM

## 2016-09-27 DIAGNOSIS — F329 Major depressive disorder, single episode, unspecified: Secondary | ICD-10-CM | POA: Diagnosis present

## 2016-09-27 DIAGNOSIS — F419 Anxiety disorder, unspecified: Secondary | ICD-10-CM | POA: Diagnosis not present

## 2016-09-27 DIAGNOSIS — Z8612 Personal history of poliomyelitis: Secondary | ICD-10-CM

## 2016-09-27 DIAGNOSIS — I739 Peripheral vascular disease, unspecified: Secondary | ICD-10-CM | POA: Diagnosis not present

## 2016-09-27 DIAGNOSIS — E785 Hyperlipidemia, unspecified: Secondary | ICD-10-CM | POA: Diagnosis not present

## 2016-09-27 DIAGNOSIS — K219 Gastro-esophageal reflux disease without esophagitis: Secondary | ICD-10-CM | POA: Diagnosis not present

## 2016-09-27 DIAGNOSIS — R269 Unspecified abnormalities of gait and mobility: Secondary | ICD-10-CM | POA: Diagnosis not present

## 2016-09-27 DIAGNOSIS — Z471 Aftercare following joint replacement surgery: Secondary | ICD-10-CM | POA: Diagnosis not present

## 2016-09-27 DIAGNOSIS — F418 Other specified anxiety disorders: Secondary | ICD-10-CM | POA: Diagnosis not present

## 2016-09-27 DIAGNOSIS — M1611 Unilateral primary osteoarthritis, right hip: Secondary | ICD-10-CM | POA: Diagnosis not present

## 2016-09-27 HISTORY — DX: Headache: R51

## 2016-09-27 HISTORY — DX: Other specified postprocedural states: R11.2

## 2016-09-27 HISTORY — PX: TOTAL HIP ARTHROPLASTY: SHX124

## 2016-09-27 HISTORY — DX: Peripheral vascular disease, unspecified: I73.9

## 2016-09-27 HISTORY — DX: Headache, unspecified: R51.9

## 2016-09-27 HISTORY — DX: Other specified postprocedural states: Z98.890

## 2016-09-27 HISTORY — DX: Other complications of anesthesia, initial encounter: T88.59XA

## 2016-09-27 HISTORY — DX: Adverse effect of unspecified anesthetic, initial encounter: T41.45XA

## 2016-09-27 LAB — TYPE AND SCREEN
ABO/RH(D): A POS
Antibody Screen: NEGATIVE

## 2016-09-27 SURGERY — ARTHROPLASTY, HIP, TOTAL, ANTERIOR APPROACH
Anesthesia: General | Site: Hip | Laterality: Right

## 2016-09-27 MED ORDER — MIDAZOLAM HCL 5 MG/5ML IJ SOLN
INTRAMUSCULAR | Status: DC | PRN
  Administered 2016-09-27 (×2): 1 mg via INTRAVENOUS

## 2016-09-27 MED ORDER — SODIUM CHLORIDE 0.9 % IR SOLN
Status: DC | PRN
  Administered 2016-09-27: 1000 mL

## 2016-09-27 MED ORDER — DEXTROSE 5 % IV SOLN
500.0000 mg | Freq: Four times a day (QID) | INTRAVENOUS | Status: DC | PRN
  Administered 2016-09-27: 500 mg via INTRAVENOUS
  Filled 2016-09-27: qty 550

## 2016-09-27 MED ORDER — DIPHENHYDRAMINE HCL 12.5 MG/5ML PO ELIX: 12.5000 mg | ORAL_SOLUTION | ORAL | Status: DC | PRN

## 2016-09-27 MED ORDER — OXYCODONE HCL 5 MG/5ML PO SOLN
5.0000 mg | Freq: Once | ORAL | Status: DC | PRN
  Filled 2016-09-27: qty 5

## 2016-09-27 MED ORDER — HYDROMORPHONE HCL 1 MG/ML IJ SOLN
INTRAMUSCULAR | Status: DC | PRN
  Administered 2016-09-27: 1 mg via INTRAVENOUS
  Administered 2016-09-27 (×2): 0.5 mg via INTRAVENOUS

## 2016-09-27 MED ORDER — SUGAMMADEX SODIUM 200 MG/2ML IV SOLN
INTRAVENOUS | Status: DC | PRN
  Administered 2016-09-27: 170 mg via INTRAVENOUS

## 2016-09-27 MED ORDER — POLYETHYLENE GLYCOL 3350 17 G PO PACK: 17.0000 g | PACK | Freq: Every day | ORAL | Status: DC | PRN

## 2016-09-27 MED ORDER — ROCURONIUM BROMIDE 10 MG/ML (PF) SYRINGE
PREFILLED_SYRINGE | INTRAVENOUS | Status: DC | PRN
  Administered 2016-09-27: 50 mg via INTRAVENOUS

## 2016-09-27 MED ORDER — SERTRALINE HCL 100 MG PO TABS
100.0000 mg | ORAL_TABLET | Freq: Every day | ORAL | Status: DC
  Administered 2016-09-28 – 2016-09-29 (×2): 100 mg via ORAL
  Filled 2016-09-27 (×2): qty 1

## 2016-09-27 MED ORDER — PROPOFOL 10 MG/ML IV BOLUS
INTRAVENOUS | Status: DC | PRN
  Administered 2016-09-27: 120 mg via INTRAVENOUS

## 2016-09-27 MED ORDER — ACETAMINOPHEN 650 MG RE SUPP: 650.0000 mg | Freq: Four times a day (QID) | RECTAL | Status: DC | PRN

## 2016-09-27 MED ORDER — ONDANSETRON HCL 4 MG PO TABS: 4.0000 mg | ORAL_TABLET | Freq: Four times a day (QID) | ORAL | Status: DC | PRN

## 2016-09-27 MED ORDER — CEFAZOLIN SODIUM-DEXTROSE 2-4 GM/100ML-% IV SOLN
2.0000 g | INTRAVENOUS | Status: AC
  Administered 2016-09-27: 2 g via INTRAVENOUS
  Filled 2016-09-27: qty 100

## 2016-09-27 MED ORDER — OXYCODONE HCL 5 MG PO TABS: 5.0000 mg | ORAL_TABLET | Freq: Once | ORAL | Status: DC | PRN

## 2016-09-27 MED ORDER — HYDROMORPHONE HCL 1 MG/ML IJ SOLN
INTRAMUSCULAR | Status: AC
  Filled 2016-09-27: qty 1

## 2016-09-27 MED ORDER — PROMETHAZINE HCL 25 MG/ML IJ SOLN: 6.2500 mg | INTRAMUSCULAR | Status: DC | PRN

## 2016-09-27 MED ORDER — ONDANSETRON HCL 4 MG/2ML IJ SOLN
INTRAMUSCULAR | Status: AC
  Filled 2016-09-27: qty 2

## 2016-09-27 MED ORDER — PROPOFOL 10 MG/ML IV BOLUS
INTRAVENOUS | Status: AC
  Filled 2016-09-27: qty 20

## 2016-09-27 MED ORDER — HYDROMORPHONE HCL 1 MG/ML IJ SOLN
0.2500 mg | INTRAMUSCULAR | Status: DC | PRN
  Administered 2016-09-27 (×3): 0.5 mg via INTRAVENOUS

## 2016-09-27 MED ORDER — CLONAZEPAM 0.5 MG PO TABS: 0.5000 mg | ORAL_TABLET | Freq: Two times a day (BID) | ORAL | Status: DC | PRN

## 2016-09-27 MED ORDER — OXYCODONE HCL 5 MG PO TABS
5.0000 mg | ORAL_TABLET | ORAL | Status: DC | PRN
  Administered 2016-09-27 (×3): 5 mg via ORAL
  Administered 2016-09-28 (×4): 10 mg via ORAL
  Administered 2016-09-29 (×2): 5 mg via ORAL
  Administered 2016-09-29: 10 mg via ORAL
  Filled 2016-09-27 (×2): qty 1
  Filled 2016-09-27: qty 2
  Filled 2016-09-27: qty 1
  Filled 2016-09-27 (×5): qty 2
  Filled 2016-09-27: qty 1

## 2016-09-27 MED ORDER — HYDROMORPHONE HCL 2 MG/ML IJ SOLN
INTRAMUSCULAR | Status: AC
  Filled 2016-09-27: qty 1

## 2016-09-27 MED ORDER — HYDROMORPHONE HCL 1 MG/ML IJ SOLN
INTRAMUSCULAR | Status: AC
  Filled 2016-09-27: qty 0.5

## 2016-09-27 MED ORDER — ROCURONIUM BROMIDE 50 MG/5ML IV SOSY
PREFILLED_SYRINGE | INTRAVENOUS | Status: AC
Start: 2016-09-27 — End: 2016-09-27
  Filled 2016-09-27: qty 5

## 2016-09-27 MED ORDER — VITAMIN D 1000 UNITS PO TABS
1000.0000 [IU] | ORAL_TABLET | Freq: Every day | ORAL | Status: DC
  Administered 2016-09-27 – 2016-09-29 (×3): 1000 [IU] via ORAL
  Filled 2016-09-27 (×3): qty 1

## 2016-09-27 MED ORDER — PANTOPRAZOLE SODIUM 40 MG PO TBEC
80.0000 mg | DELAYED_RELEASE_TABLET | Freq: Every day | ORAL | Status: DC
  Administered 2016-09-28 – 2016-09-29 (×2): 80 mg via ORAL
  Filled 2016-09-27 (×2): qty 2

## 2016-09-27 MED ORDER — 0.9 % SODIUM CHLORIDE (POUR BTL) OPTIME
TOPICAL | Status: DC | PRN
  Administered 2016-09-27: 1000 mL

## 2016-09-27 MED ORDER — FENTANYL CITRATE (PF) 100 MCG/2ML IJ SOLN
INTRAMUSCULAR | Status: DC | PRN
  Administered 2016-09-27 (×4): 50 ug via INTRAVENOUS

## 2016-09-27 MED ORDER — SUGAMMADEX SODIUM 200 MG/2ML IV SOLN
INTRAVENOUS | Status: AC
  Filled 2016-09-27: qty 2

## 2016-09-27 MED ORDER — METHOCARBAMOL 500 MG PO TABS
500.0000 mg | ORAL_TABLET | Freq: Four times a day (QID) | ORAL | Status: DC | PRN
  Administered 2016-09-27 – 2016-09-29 (×2): 500 mg via ORAL
  Filled 2016-09-27 (×2): qty 1

## 2016-09-27 MED ORDER — ALUM & MAG HYDROXIDE-SIMETH 200-200-20 MG/5ML PO SUSP: 30.0000 mL | ORAL | Status: DC | PRN

## 2016-09-27 MED ORDER — EPHEDRINE SULFATE-NACL 50-0.9 MG/10ML-% IV SOSY
PREFILLED_SYRINGE | INTRAVENOUS | Status: DC | PRN
  Administered 2016-09-27: 5 mg via INTRAVENOUS

## 2016-09-27 MED ORDER — CEFAZOLIN SODIUM-DEXTROSE 1-4 GM/50ML-% IV SOLN
1.0000 g | Freq: Four times a day (QID) | INTRAVENOUS | Status: AC
  Administered 2016-09-27 (×2): 1 g via INTRAVENOUS
  Filled 2016-09-27 (×3): qty 50

## 2016-09-27 MED ORDER — TRANEXAMIC ACID 1000 MG/10ML IV SOLN
1000.0000 mg | INTRAVENOUS | Status: AC
  Administered 2016-09-27: 1000 mg via INTRAVENOUS
  Filled 2016-09-27: qty 1100

## 2016-09-27 MED ORDER — FENTANYL CITRATE (PF) 100 MCG/2ML IJ SOLN
INTRAMUSCULAR | Status: AC
  Filled 2016-09-27: qty 2

## 2016-09-27 MED ORDER — ACETAMINOPHEN 10 MG/ML IV SOLN
INTRAVENOUS | Status: AC
  Filled 2016-09-27: qty 100

## 2016-09-27 MED ORDER — CHLORHEXIDINE GLUCONATE 4 % EX LIQD: 60.0000 mL | Freq: Once | CUTANEOUS | Status: DC

## 2016-09-27 MED ORDER — METOCLOPRAMIDE HCL 5 MG/ML IJ SOLN: 5.0000 mg | Freq: Three times a day (TID) | INTRAMUSCULAR | Status: DC | PRN

## 2016-09-27 MED ORDER — ATROPINE SULFATE 1 MG/10ML IJ SOSY
PREFILLED_SYRINGE | INTRAMUSCULAR | Status: AC
  Filled 2016-09-27: qty 10

## 2016-09-27 MED ORDER — LACTATED RINGERS IV SOLN
INTRAVENOUS | Status: DC
  Administered 2016-09-27 (×2): via INTRAVENOUS

## 2016-09-27 MED ORDER — HYDROMORPHONE HCL 1 MG/ML IJ SOLN: 1.0000 mg | INTRAMUSCULAR | Status: DC | PRN

## 2016-09-27 MED ORDER — ACETAMINOPHEN 325 MG PO TABS: 650.0000 mg | ORAL_TABLET | Freq: Four times a day (QID) | ORAL | Status: DC | PRN

## 2016-09-27 MED ORDER — ONDANSETRON HCL 4 MG/2ML IJ SOLN
INTRAMUSCULAR | Status: DC | PRN
  Administered 2016-09-27: 4 mg via INTRAVENOUS

## 2016-09-27 MED ORDER — ADULT MULTIVITAMIN W/MINERALS CH
1.0000 | ORAL_TABLET | Freq: Every day | ORAL | Status: DC
  Administered 2016-09-28 – 2016-09-29 (×2): 1 via ORAL
  Filled 2016-09-27 (×2): qty 1

## 2016-09-27 MED ORDER — PHENOL 1.4 % MT LIQD: 1.0000 | OROMUCOSAL | Status: DC | PRN

## 2016-09-27 MED ORDER — MENTHOL 3 MG MT LOZG: 1.0000 | LOZENGE | OROMUCOSAL | Status: DC | PRN

## 2016-09-27 MED ORDER — ACETAMINOPHEN 10 MG/ML IV SOLN
INTRAVENOUS | Status: DC | PRN
  Administered 2016-09-27: 1000 mg via INTRAVENOUS

## 2016-09-27 MED ORDER — DOCUSATE SODIUM 100 MG PO CAPS
100.0000 mg | ORAL_CAPSULE | Freq: Two times a day (BID) | ORAL | Status: DC
  Administered 2016-09-27 – 2016-09-29 (×4): 100 mg via ORAL
  Filled 2016-09-27 (×4): qty 1

## 2016-09-27 MED ORDER — STERILE WATER FOR IRRIGATION IR SOLN
Status: DC | PRN
  Administered 2016-09-27: 2000 mL

## 2016-09-27 MED ORDER — SODIUM CHLORIDE 0.9 % IV SOLN
INTRAVENOUS | Status: DC
  Administered 2016-09-28: 05:00:00 via INTRAVENOUS

## 2016-09-27 MED ORDER — ONDANSETRON HCL 4 MG/2ML IJ SOLN: 4.0000 mg | Freq: Four times a day (QID) | INTRAMUSCULAR | Status: DC | PRN

## 2016-09-27 MED ORDER — DEXAMETHASONE SODIUM PHOSPHATE 10 MG/ML IJ SOLN
INTRAMUSCULAR | Status: DC | PRN
  Administered 2016-09-27: 10 mg via INTRAVENOUS

## 2016-09-27 MED ORDER — ASPIRIN 81 MG PO CHEW
81.0000 mg | CHEWABLE_TABLET | Freq: Two times a day (BID) | ORAL | Status: DC
  Administered 2016-09-27 – 2016-09-29 (×4): 81 mg via ORAL
  Filled 2016-09-27 (×4): qty 1

## 2016-09-27 MED ORDER — LIDOCAINE 2% (20 MG/ML) 5 ML SYRINGE
INTRAMUSCULAR | Status: DC | PRN
  Administered 2016-09-27: 80 mg via INTRAVENOUS

## 2016-09-27 MED ORDER — EPHEDRINE 5 MG/ML INJ
INTRAVENOUS | Status: AC
Start: 2016-09-27 — End: 2016-09-27
  Filled 2016-09-27: qty 10

## 2016-09-27 MED ORDER — KETOROLAC TROMETHAMINE 15 MG/ML IJ SOLN
7.5000 mg | Freq: Four times a day (QID) | INTRAMUSCULAR | Status: AC
  Administered 2016-09-27 – 2016-09-28 (×4): 7.5 mg via INTRAVENOUS
  Filled 2016-09-27 (×4): qty 1

## 2016-09-27 MED ORDER — METOCLOPRAMIDE HCL 5 MG PO TABS: 5.0000 mg | ORAL_TABLET | Freq: Three times a day (TID) | ORAL | Status: DC | PRN

## 2016-09-27 MED ORDER — MEPERIDINE HCL 50 MG/ML IJ SOLN: 6.2500 mg | INTRAMUSCULAR | Status: DC | PRN

## 2016-09-27 MED ORDER — MIDAZOLAM HCL 2 MG/2ML IJ SOLN
INTRAMUSCULAR | Status: AC
  Filled 2016-09-27: qty 2

## 2016-09-27 MED ORDER — ROSUVASTATIN CALCIUM 20 MG PO TABS
20.0000 mg | ORAL_TABLET | Freq: Every day | ORAL | Status: DC
  Administered 2016-09-27 – 2016-09-28 (×2): 20 mg via ORAL
  Filled 2016-09-27 (×2): qty 1

## 2016-09-27 SURGICAL SUPPLY — 33 items
BAG ZIPLOCK 12X15 (MISCELLANEOUS) IMPLANT
BENZOIN TINCTURE PRP APPL 2/3 (GAUZE/BANDAGES/DRESSINGS) ×2 IMPLANT
BLADE SAW SGTL 18X1.27X75 (BLADE) ×2 IMPLANT
CAPT HIP TOTAL 2 ×2 IMPLANT
CELLS DAT CNTRL 66122 CELL SVR (MISCELLANEOUS) ×1 IMPLANT
COVER PERINEAL POST (MISCELLANEOUS) ×2 IMPLANT
COVER SURGICAL LIGHT HANDLE (MISCELLANEOUS) ×2 IMPLANT
DRAPE STERI IOBAN 125X83 (DRAPES) ×2 IMPLANT
DRAPE U-SHAPE 47X51 STRL (DRAPES) ×4 IMPLANT
DRSG AQUACEL AG ADV 3.5X10 (GAUZE/BANDAGES/DRESSINGS) ×2 IMPLANT
DURAPREP 26ML APPLICATOR (WOUND CARE) ×2 IMPLANT
ELECT REM PT RETURN 15FT ADLT (MISCELLANEOUS) ×2 IMPLANT
GAUZE XEROFORM 1X8 LF (GAUZE/BANDAGES/DRESSINGS) IMPLANT
GLOVE BIO SURGEON STRL SZ7.5 (GLOVE) ×2 IMPLANT
GLOVE BIOGEL PI IND STRL 8 (GLOVE) ×2 IMPLANT
GLOVE BIOGEL PI INDICATOR 8 (GLOVE) ×2
GLOVE ECLIPSE 8.0 STRL XLNG CF (GLOVE) ×2 IMPLANT
GOWN STRL REUS W/TWL XL LVL3 (GOWN DISPOSABLE) ×4 IMPLANT
HANDPIECE INTERPULSE COAX TIP (DISPOSABLE) ×1
HOLDER FOLEY CATH W/STRAP (MISCELLANEOUS) ×2 IMPLANT
PACK ANTERIOR HIP CUSTOM (KITS) ×2 IMPLANT
RTRCTR WOUND ALEXIS 18CM MED (MISCELLANEOUS) ×2
SET HNDPC FAN SPRY TIP SCT (DISPOSABLE) ×1 IMPLANT
STAPLER VISISTAT 35W (STAPLE) IMPLANT
STRIP CLOSURE SKIN 1/2X4 (GAUZE/BANDAGES/DRESSINGS) ×2 IMPLANT
SUT ETHIBOND NAB CT1 #1 30IN (SUTURE) ×2 IMPLANT
SUT MNCRL AB 4-0 PS2 18 (SUTURE) ×2 IMPLANT
SUT VIC AB 0 CT1 36 (SUTURE) ×2 IMPLANT
SUT VIC AB 1 CT1 36 (SUTURE) ×2 IMPLANT
SUT VIC AB 2-0 CT1 27 (SUTURE) ×2
SUT VIC AB 2-0 CT1 TAPERPNT 27 (SUTURE) ×2 IMPLANT
TRAY FOLEY W/METER SILVER 16FR (SET/KITS/TRAYS/PACK) IMPLANT
YANKAUER SUCT BULB TIP 10FT TU (MISCELLANEOUS) ×2 IMPLANT

## 2016-09-27 NOTE — Progress Notes (Signed)
X-ray results noted 

## 2016-09-27 NOTE — Anesthesia Preprocedure Evaluation (Addendum)
Anesthesia Evaluation  Patient identified by MRN, date of birth, ID band Patient awake    Reviewed: Allergy & Precautions, NPO status , Patient's Chart, lab work & pertinent test results  History of Anesthesia Complications (+) PONV  Airway Mallampati: II  TM Distance: >3 FB Neck ROM: Full    Dental no notable dental hx. (+) Dental Advisory Given, Chipped   Pulmonary neg pulmonary ROS,    Pulmonary exam normal breath sounds clear to auscultation       Cardiovascular + Peripheral Vascular Disease  negative cardio ROS Normal cardiovascular exam Rhythm:Regular Rate:Normal     Neuro/Psych  Headaches, Anxiety Depression negative neurological ROS  negative psych ROS   GI/Hepatic negative GI ROS, Neg liver ROS, GERD  ,  Endo/Other  negative endocrine ROS  Renal/GU negative Renal ROS  negative genitourinary   Musculoskeletal negative musculoskeletal ROS (+) Arthritis ,   Abdominal   Peds negative pediatric ROS (+)  Hematology negative hematology ROS (+)   Anesthesia Other Findings   Reproductive/Obstetrics negative OB ROS                            Anesthesia Physical Anesthesia Plan  ASA: II  Anesthesia Plan: General   Post-op Pain Management:    Induction: Intravenous  PONV Risk Score and Plan: 4 or greater and Ondansetron, Dexamethasone, Propofol, Midazolam, Scopolamine patch - Pre-op and Treatment may vary due to age or medical condition  Airway Management Planned:   Additional Equipment:   Intra-op Plan:   Post-operative Plan: Extubation in OR  Informed Consent: I have reviewed the patients History and Physical, chart, labs and discussed the procedure including the risks, benefits and alternatives for the proposed anesthesia with the patient or authorized representative who has indicated his/her understanding and acceptance.   Dental advisory given  Plan Discussed with:  CRNA  Anesthesia Plan Comments:         Anesthesia Quick Evaluation

## 2016-09-27 NOTE — Progress Notes (Signed)
Portable AP Pelvis X-RAY done.

## 2016-09-27 NOTE — H&P (Signed)
TOTAL HIP ADMISSION H&P  Patient is admitted for right total hip arthroplasty.  Subjective:  Chief Complaint: right hip pain  HPI: Joanne Edwards, 76 y.o. female, has a history of pain and functional disability in the right hip(s) due to arthritis and patient has failed non-surgical conservative treatments for greater than 12 weeks to include NSAID's and/or analgesics, corticosteriod injections, flexibility and strengthening excercises, supervised PT with diminished ADL's post treatment, use of assistive devices and activity modification.  Onset of symptoms was gradual starting 10 years ago with gradually worsening course since that time.The patient noted no past surgery on the right hip(s).  Patient currently rates pain in the right hip at 10 out of 10 with activity. Patient has night pain, worsening of pain with activity and weight bearing, pain that interfers with activities of daily living and pain with passive range of motion. Patient has evidence of subchondral sclerosis, periarticular osteophytes and joint space narrowing by imaging studies. This condition presents safety issues increasing the risk of falls.  There is no current active infection.  Patient Active Problem List   Diagnosis Date Noted  . Unilateral primary osteoarthritis, right hip 07/24/2016  . Trochanteric bursitis, right hip 07/24/2016  . Urinary incontinence 02/06/2016  . PCP NOTES >>>>>> 02/22/2015  . Thyroid cyst 02/21/2015  . Pain and swelling of left lower leg 05/17/2014  . Dysfunction of right eustachian tube 05/17/2014  . Annual physical exam 01/06/2014  . Left leg weakness 09/28/2013  . GERD (gastroesophageal reflux disease) 07/02/2013  . Hyperlipidemia 07/02/2013  . Depression with anxiety 07/02/2013  . Varicose veins of lower extremities with other complications 09/29/7626  . Special screening for malignant neoplasms, colon 10/14/2012   Past Medical History:  Diagnosis Date  . Anxiety   . Arthritis   .  Balance problem    going down steps  . Complication of anesthesia    patient does not want any spinal anesthesia  . Depression    lost a son 2011 (aneurysm)  . GERD (gastroesophageal reflux disease)   . Headache    hx of   . Hyperlipemia   . Peripheral vascular disease ([place])   . Polio    as a child--loss muscle of lower left leg  . Polio   . PONV (postoperative nausea and vomiting)     Past Surgical History:  Procedure Laterality Date  . BREAST CYST EXCISION Left   . BREAST SURGERY     reduction  . cysts removed from bilateral ankles     . ear pinned back     . EYE SURGERY     "fat removed from under R eye"  . OOPHORECTOMY Bilateral    was removed b/c mother hx of ovarian cancer  . REDUCTION MAMMAPLASTY    . REPLACEMENT TOTAL KNEE  2006   right knee  . TONSILLECTOMY    . tummy tuck  1997    No prescriptions prior to admission.   No Known Allergies  Social History  Substance Use Topics  . Smoking status: Never Smoker  . Smokeless tobacco: Never Used  . Alcohol use No    Family History  Problem Relation Age of Onset  . Ovarian cancer Mother   . CAD Father        h/o angina dx age 90s  . Aneurysm Son   . Colon cancer Neg Hx   . Esophageal cancer Neg Hx   . Rectal cancer Neg Hx   . Stomach cancer Neg Hx   .  Heart attack Neg Hx   . Breast cancer Neg Hx      Review of Systems  Musculoskeletal: Positive for joint pain.  All other systems reviewed and are negative.   Objective:  Physical Exam  Constitutional: She is oriented to person, place, and time. She appears well-developed and well-nourished.  HENT:  Head: Normocephalic and atraumatic.  Eyes: EOM are normal. Pupils are equal, round, and reactive to light.  Neck: Normal range of motion. Neck supple.  Cardiovascular: Normal rate and regular rhythm.   Respiratory: Effort normal and breath sounds normal.  GI: Soft. Bowel sounds are normal.  Musculoskeletal:       Right hip: She exhibits decreased  range of motion, decreased strength, tenderness and bony tenderness.  Neurological: She is alert and oriented to person, place, and time.  Skin: Skin is warm and dry.  Psychiatric: She has a normal mood and affect.    Vital signs in last 24 hours:    Labs:   Estimated body mass index is 31.36 kg/m as calculated from the following:   Height as of 09/18/16: 5' 3.7" (1.618 m).   Weight as of 09/18/16: 181 lb (82.1 kg).   Imaging Review Plain radiographs demonstrate severe degenerative joint disease of the right hip(s). The bone quality appears to be excellent for age and reported activity level.  Assessment/Plan:  End stage arthritis, right hip(s)  The patient history, physical examination, clinical judgement of the provider and imaging studies are consistent with end stage degenerative joint disease of the right hip(s) and total hip arthroplasty is deemed medically necessary. The treatment options including medical management, injection therapy, arthroscopy and arthroplasty were discussed at length. The risks and benefits of total hip arthroplasty were presented and reviewed. The risks due to aseptic loosening, infection, stiffness, dislocation/subluxation,  thromboembolic complications and other imponderables were discussed.  The patient acknowledged the explanation, agreed to proceed with the plan and consent was signed. Patient is being admitted for inpatient treatment for surgery, pain control, PT, OT, prophylactic antibiotics, VTE prophylaxis, progressive ambulation and ADL's and discharge planning.The patient is planning to be discharged home with home health services

## 2016-09-27 NOTE — Transfer of Care (Signed)
Immediate Anesthesia Transfer of Care Note  Patient: Joanne Edwards  Procedure(s) Performed: Procedure(s): RIGHT TOTAL HIP ARTHROPLASTY ANTERIOR APPROACH (Right)  Patient Location: PACU  Anesthesia Type:General  Level of Consciousness: awake, alert , oriented and patient cooperative  Airway & Oxygen Therapy: Patient Spontanous Breathing and Patient connected to face mask oxygen  Post-op Assessment: Report given to RN, Post -op Vital signs reviewed and stable and Patient moving all extremities  Post vital signs: Reviewed and stable  Last Vitals:  Vitals:   09/27/16 0900  BP: 139/78  Pulse: 73  Resp: 18  Temp: 37 C    Last Pain:  Vitals:   09/27/16 0900  TempSrc: Oral      Patients Stated Pain Goal: 3 ([id number])  Complications: No apparent anesthesia complications

## 2016-09-27 NOTE — Brief Op Note (Signed)
09/27/2016  12:02 PM  PATIENT:  Joanne Edwards  76 y.o. female  PRE-OPERATIVE DIAGNOSIS:  right hip osteoarthritis  POST-OPERATIVE DIAGNOSIS:  right hip osteoarthritis  PROCEDURE:  Procedure(s): RIGHT TOTAL HIP ARTHROPLASTY ANTERIOR APPROACH (Right)  SURGEON:  Surgeon(s) and Role:    Mcarthur Rossetti, MD - Primary  PHYSICIAN ASSISTANT: Benita Stabile, PA-C  ANESTHESIA:   general  EBL:  Total I/O In: 1000 [I.V.:1000] Out: 200 [Blood:200]  COUNTS:  YES  DICTATION: .Other Dictation: Dictation Number [id number]  PLAN OF CARE: Admit to inpatient   PATIENT DISPOSITION:  PACU - hemodynamically stable.   Delay start of Pharmacological VTE agent (>24hrs) due to surgical blood loss or risk of bleeding: no

## 2016-09-27 NOTE — Anesthesia Postprocedure Evaluation (Signed)
Anesthesia Post Note  Patient: Joanne Edwards  Procedure(s) Performed: Procedure(s) (LRB): RIGHT TOTAL HIP ARTHROPLASTY ANTERIOR APPROACH (Right)     Patient location during evaluation: PACU Anesthesia Type: General Level of consciousness: awake and alert Pain management: pain level controlled Vital Signs Assessment: post-procedure vital signs reviewed and stable Respiratory status: spontaneous breathing, nonlabored ventilation and respiratory function stable Cardiovascular status: blood pressure returned to baseline and stable Postop Assessment: no signs of nausea or vomiting Anesthetic complications: no    Last Vitals:  Vitals:   09/27/16 1415 09/27/16 1430  BP: (!) 118/50 133/82  Pulse: 85 88  Resp: (!) 24 17  Temp:  36.6 C    Last Pain:  Vitals:   09/27/16 1400  TempSrc:   PainSc: [place]

## 2016-09-27 NOTE — Anesthesia Procedure Notes (Signed)
Procedure Name: Intubation Date/Time: 09/27/2016 10:53 AM Performed by: Cynda Familia Pre-anesthesia Checklist: Patient identified, Emergency Drugs available, Suction available and Patient being monitored Patient Re-evaluated:Patient Re-evaluated prior to inductionOxygen Delivery Method: Circle System Utilized Preoxygenation: Pre-oxygenation with 100% oxygen Intubation Type: IV induction Ventilation: Mask ventilation without difficulty Laryngoscope Size: Miller and 2 Grade View: Grade I Tube type: Oral Tube size: 7.0 mm Number of attempts: 1 Airway Equipment and Method: Stylet Placement Confirmation: ETT inserted through vocal cords under direct vision,  positive ETCO2 and breath sounds checked- equal and bilateral Secured at: 22 cm Tube secured with: Tape Dental Injury: Teeth and Oropharynx as per pre-operative assessment  Comments: Smooth Iv induction--- Sabra Heck--- AM CRNA atraumatic  --- teeth with irregular surfaces prior to laryngoscopy-- teeth and mouth as preop--- bilat BS Sabra Heck

## 2016-09-27 NOTE — Op Note (Signed)
NAMEWILLOW, Joanne Edwards NO.:  [id number]  MEDICAL RECORD NO.:  [id number]  LOCATION:  [facility]                         FACILITY:  [facility]  PHYSICIAN:  Lind Guest. Ninfa Linden, M.D.DATE OF BIRTH:  07/15/1940  DATE OF PROCEDURE:  09/27/2016 DATE OF DISCHARGE:                              OPERATIVE REPORT   PREOPERATIVE DIAGNOSIS:  Primary osteoarthritis and degenerative joint disease, right hip.  POSTOPERATIVE DIAGNOSIS:  Primary osteoarthritis and degenerative joint disease, right hip.  PROCEDURE:  Right total hip arthroplasty through direct anterior approach.  IMPLANTS:  DePuy Sector Gription acetabular component size 50 with a single screw, size 32+ 0 neutral polyethylene liner, size 8 Corail femoral component with standard offset, size 32+ 1 ceramic hip ball.  SURGEON:  Mcarthur Rossetti, MD.  ASSISTING:  Erskine Emery, PA-C.  ANESTHESIA:  General.  BLOOD LOSS:  Less than 200 mL.  ANTIBIOTICS:  2 g IV Ancef.  COMPLICATIONS:  None.  INDICATIONS:  Ms. Scannell is a 76 year old female, well known to me.  She has debilitating arthritis involving her right hip that has gotten worse with time.  It has detrimentally affected her activities of daily living, her quality of life, and her mobility.  She is having severe pain and is daily.  It is 10/10.  She does wish to proceed with a total hip arthroplasty given the failure of conservative treatment.  She understands the risk of acute blood loss anemia, nerve and vessel injury, fracture, infection, dislocation, and DVT.  She understands our goals are decreased pain, improved mobility, and overall improved quality of life.  PROCEDURE DESCRIPTION:  After informed consent was obtained, appropriate right hip was marked.  She was brought to the operating room, placed supine on the operating table.  General anesthesia was then obtained. She was then placed supine on the Hana fracture table, the perineal post in  place and both legs in InLine skeletal traction boots, but no traction applied.  Her right operative hip was prepped and draped with DuraPrep and sterile drapes.  A time-out was called and she was identified as correct patient and correct right hip.  We then made incision just inferior and posterior to the anterior superior iliac spine and carried this obliquely down the leg.  We dissected down to the tensor fascia lata muscle.  Tensor fascia was then divided longitudinally to proceed with a direct anterior approach to the hip. We identified and cauterized the circumflex vessels.  I then opened up the hip capsule in L-type format finding a large joint effusion and significant periarticular osteophytes.  We placed Cobra retractors around the medial and lateral femoral neck and then made our femoral neck cut proximal to the lesser trochanter with an oscillating saw and completed this on osteotome.  We placed a corkscrew guide in the femoral head and removed the femoral head in its entirety and found to be devoid of cartilage.  We then cleaned the remnants of acetabular labrum and other debris.  We then began reaming under direct visualization from a size 43 reamer going up to a size 50 with all reamers under direct visualization and the last reamer under  direct fluoroscopy, so we could obtain our depth of reaming, our inclination, and anteversion.  Once we were pleased with this, we placed the real DePuy Sector Gription acetabular component size 50, and a single screw, and a 32+ 0 neutral polyethylene liner for that size acetabular component.  Attention was then turned to the femur.  With the leg externally rotated to 120 degrees, extended and adducted, we were able to place the Mueller retractor medially and a Hohmann retractor behind the greater trochanter.  I released the lateral joint capsule and used a box cutting osteotome to enter the femoral canal and a rongeur to lateralize.   We then began broaching with only just a size 8 broach because it filled the canal completely.  We trialed a standard offset femoral neck and a 32+ 1 hip ball, rolled leg back over and up with traction and rotation reducing the pelvis.  We were pleased with leg length, offset, stability, and range of motion.  We then dislocated the hip and removed the trial components.  We were able to place the real Corail femoral component with standard offset and the real 32+ 1 ceramic hip ball and again reduced in the acetabulum and it was stable.  We then irrigated the soft tissue with normal saline solution using pulsatile lavage.  We closed the joint capsule with interrupted #1 Ethibond suture, followed by running #1 Vicryl in the tensor fascia, 0 Vicryl in the deep tissue, 2-0 Vicryl in the subcutaneous tissue, 4-0 Monocryl subcuticular stitch, and Steri-Strips on the skin.  An Aquacel dressing was applied.  She was taken off the Hana table, awakened, extubated, and taken to recovery room in stable condition.  All final counts were correct.  There were no complications noted.  Of note, Erskine Emery, PA-C, assisted in the entire case.  His assistance was crucial for facilitating all aspects of this case.     Lind Guest. Ninfa Linden, M.D.     CYB/MEDQ  D:  09/27/2016  T:  09/27/2016  Job:  [id number]

## 2016-09-28 LAB — CBC
HEMATOCRIT: 29.2 % — AB (ref 36.0–46.0)
HEMOGLOBIN: 10 g/dL — AB (ref 12.0–15.0)
MCH: 31.2 pg (ref 26.0–34.0)
MCHC: 34.2 g/dL (ref 30.0–36.0)
MCV: 91 fL (ref 78.0–100.0)
Platelets: 180 10*3/uL (ref 150–400)
RBC: 3.21 MIL/uL — AB (ref 3.87–5.11)
RDW: 13 % (ref 11.5–15.5)
WBC: 9.2 10*3/uL (ref 4.0–10.5)

## 2016-09-28 LAB — BASIC METABOLIC PANEL
ANION GAP: 8 (ref 5–15)
BUN: 16 mg/dL (ref 6–20)
CALCIUM: 8.7 mg/dL — AB (ref 8.9–10.3)
CO2: 28 mmol/L (ref 22–32)
Chloride: 104 mmol/L (ref 101–111)
Creatinine, Ser: 0.72 mg/dL (ref 0.44–1.00)
GFR calc non Af Amer: >60 mL/min (ref >60)
Glucose, Bld: 130 mg/dL — ABNORMAL HIGH (ref 65–99)
POTASSIUM: 4 mmol/L (ref 3.5–5.1)
Sodium: 140 mmol/L (ref 135–145)

## 2016-09-28 MED ORDER — ASPIRIN 81 MG PO CHEW
81.0000 mg | CHEWABLE_TABLET | Freq: Two times a day (BID) | ORAL | 0 refills | Status: DC
Start: 2016-09-28 — End: 2016-12-11

## 2016-09-28 MED ORDER — METHOCARBAMOL 500 MG PO TABS: 500.0000 mg | ORAL_TABLET | Freq: Four times a day (QID) | ORAL | 0 refills | Status: DC | PRN

## 2016-09-28 MED ORDER — OXYCODONE-ACETAMINOPHEN 5-325 MG PO TABS: 1.0000 | ORAL_TABLET | ORAL | 0 refills | Status: DC | PRN

## 2016-09-28 NOTE — Discharge Instructions (Signed)

## 2016-09-28 NOTE — Progress Notes (Signed)
Physical Therapy Treatment Patient Details Name: Joanne Edwards MRN: [id number] DOB: 10/06/1940 Today's Date: 09/28/2016    History of Present Illness Pt s/p R THR and with hx of R TKR, PVD and Polio affecting L LE    PT Comments    Pt progressing well with mobility and hopeful for dc home tomorrow.   Follow Up Recommendations  Home health PT     Equipment Recommendations  Rolling walker with 5" wheels    Recommendations for Other Services OT consult     Precautions / Restrictions Precautions Precautions: Fall Restrictions Weight Bearing Restrictions: No Other Position/Activity Restrictions: WBAT    Mobility  Bed Mobility Overal bed mobility: Needs Assistance Bed Mobility: Sit to Supine       Sit to supine: Min assist   General bed mobility comments: cues for sequence and use of L LE to self assist  Transfers Overall transfer level: Needs assistance Equipment used: Rolling walker (2 wheeled) Transfers: Sit to/from Stand Sit to Stand: Min assist;Min guard         General transfer comment: VC for hand placement  Ambulation/Gait Ambulation/Gait assistance: Min guard Ambulation Distance (Feet): 250 Feet Assistive device: Rolling walker (2 wheeled) Gait Pattern/deviations: Step-to pattern;Step-through pattern;Decreased step length - right;Decreased step length - left;Shuffle;Trunk flexed Gait velocity: decr Gait velocity interpretation: Below normal speed for age/gender General Gait Details: cues for posture, position from RW and initial sequence   Stairs            Wheelchair Mobility    Modified Rankin (Stroke Patients Only)       Balance                                            Cognition Arousal/Alertness: Awake/alert Behavior During Therapy: WFL for tasks assessed/performed Overall Cognitive Status: Within Functional Limits for tasks assessed                                        Exercises Total  Joint Exercises Ankle Circles/Pumps: AROM;Both;20 reps;Supine Quad Sets: AROM;Both;10 reps;Supine Heel Slides: AAROM;Right;20 reps;Supine Hip ABduction/ADduction: AAROM;Right;15 reps;Supine    General Comments        Pertinent Vitals/Pain Pain Assessment: 0-10 Pain Score: 3  Pain Location: R hip Pain Descriptors / Indicators: Sore Pain Intervention(s): Limited activity within patient's tolerance;Monitored during session    Home Living                      Prior Function            PT Goals (current goals can now be found in the care plan section) Acute Rehab PT Goals Patient Stated Goal: home tomorrow PT Goal Formulation: With patient Time For Goal Achievement: 10/02/16 Potential to Achieve Goals: Good    Frequency    7X/week      PT Plan Current plan remains appropriate    Co-evaluation              AM-PAC PT "6 Clicks" Daily Activity  Outcome Measure  Difficulty turning over in bed (including adjusting bedclothes, sheets and blankets)?: A Lot Difficulty moving from lying on back to sitting on the side of the bed? : A Little Difficulty sitting down on and standing up from a chair with arms (  e.g., wheelchair, bedside commode, etc,.)?: A Little Help needed moving to and from a bed to chair (including a wheelchair)?: A Little Help needed walking in hospital room?: A Little Help needed climbing 3-5 steps with a railing? : A Little 6 Click Score: 17    End of Session Equipment Utilized During Treatment: Gait belt Activity Tolerance: Patient tolerated treatment well Patient left: in bed;with call bell/phone within reach;with family/visitor present Nurse Communication: Mobility status PT Visit Diagnosis: History of falling (Z91.81);Difficulty in walking, not elsewhere classified (R26.2)     Time: 1334-1400 PT Time Calculation (min) (ACUTE ONLY): 26 min  Charges:  $Gait Training: 8-22 mins $Therapeutic Exercise: 8-22 mins                     G Codes:       Pg [phone]    Karyme Mcconathy 09/28/2016, 3:46 PM

## 2016-09-28 NOTE — Progress Notes (Signed)
Subjective: 1 Day Post-Op Procedure(s) (LRB): RIGHT TOTAL HIP ARTHROPLASTY ANTERIOR APPROACH (Right) Patient reports pain as moderate.  Working well with therapy.  Acute blood loss anemia from surgery, but tolerating well.  Objective: Vital signs in last 24 hours: Temp:  [97.5 F (36.4 C)-98.5 F (36.9 C)] 98.5 F (36.9 C) (06/23 0517) Pulse Rate:  [67-88] 78 (06/23 0517) Resp:  [13-24] 16 (06/23 0517) BP: (90-149)/(48-82) 124/53 (06/23 0517) SpO2:  [92 %-100 %] 98 % (06/23 0517)  Intake/Output from previous day: 06/22 0701 - 06/23 0700 In: 3723.8 [P.O.:760; I.V.:2698.8; IV Piggyback:265] Out: 6222 [Urine:1475; Blood:200] Intake/Output this shift: Total I/O In: 749.3 [P.O.:240; I.V.:509.3] Out: 475 [Urine:475]   Recent Labs  09/28/16 0453  HGB 10.0*    Recent Labs  09/28/16 0453  WBC 9.2  RBC 3.21*  HCT 29.2*  PLT 180    Recent Labs  09/28/16 0453  NA 140  K 4.0  CL 104  CO2 28  BUN 16  CREATININE 0.72  GLUCOSE 130*  CALCIUM 8.7*   No results for input(s): LABPT, INR in the last 72 hours.  Sensation intact distally Intact pulses distally Dorsiflexion/Plantar flexion intact Incision: dressing C/D/I  Assessment/Plan: 1 Day Post-Op Procedure(s) (LRB): RIGHT TOTAL HIP ARTHROPLASTY ANTERIOR APPROACH (Right) Up with therapy Plan for discharge tomorrow Discharge home with home health  Joanne Edwards 09/28/2016, 1:01 PM

## 2016-09-28 NOTE — Evaluation (Signed)
Physical Therapy Evaluation Patient Details Name: Joanne Edwards MRN: [id number] DOB: 1940-11-06 Today's Date: 09/28/2016   History of Present Illness  Pt s/p R THR and with hx of R TKR, PVD and Polio affecting L LE  Clinical Impression  Pt s/p R THR and presents with decreased R LE strength/ROM and post op pain limiting functional mobility.  Pt should progress to dc home with family assist.    Follow Up Recommendations Home health PT    Equipment Recommendations  Rolling walker with 5" wheels    Recommendations for Other Services OT consult     Precautions / Restrictions Precautions Precautions: Knee;Fall Restrictions Weight Bearing Restrictions: No      Mobility  Bed Mobility Overal bed mobility: Needs Assistance Bed Mobility: Supine to Sit     Supine to sit: Min assist;Mod assist     General bed mobility comments: cues for sequence and use of L LE to self assist  Transfers Overall transfer level: Needs assistance Equipment used: Rolling walker (2 wheeled) Transfers: Sit to/from Stand Sit to Stand: Min assist Stand pivot transfers: Supervision       General transfer comment: VC for hand placement  Ambulation/Gait Ambulation/Gait assistance: Min assist Ambulation Distance (Feet): 95 Feet Assistive device: Rolling walker (2 wheeled) Gait Pattern/deviations: Step-to pattern;Decreased step length - right;Decreased step length - left;Shuffle;Trunk flexed Gait velocity: decr Gait velocity interpretation: Below normal speed for age/gender General Gait Details: cues for posture, position from RW and initial sequence  Stairs            Wheelchair Mobility    Modified Rankin (Stroke Patients Only)       Balance                                             Pertinent Vitals/Pain Pain Assessment: 0-10 Pain Score: 4  Pain Location: R hip Pain Descriptors / Indicators: Sore Pain Intervention(s): Limited activity within patient's  tolerance;Monitored during session;Premedicated before session    Home Living Family/patient expects to be discharged to:: Private residence Living Arrangements: Spouse/significant other Available Help at Discharge: Family Type of Home: House Home Access: Level entry     Home Layout: One level Home Equipment: Radio producer - single point      Prior Function Level of Independence: Independent               Hand Dominance        Extremity/Trunk Assessment   Upper Extremity Assessment Upper Extremity Assessment: Overall WFL for tasks assessed    Lower Extremity Assessment Lower Extremity Assessment: RLE deficits/detail;LLE deficits/detail RLE Deficits / Details: Strength at hip 2/5 with AAROM at hip to 90 flex and 15 abd LLE Deficits / Details: Polio at age 74 affecting lower leg musculature but functional strength present       Communication   Communication: No difficulties  Cognition Arousal/Alertness: Awake/alert Behavior During Therapy: WFL for tasks assessed/performed Overall Cognitive Status: Within Functional Limits for tasks assessed                                        General Comments      Exercises Total Joint Exercises Ankle Circles/Pumps: AROM;Both;20 reps;Supine Quad Sets: AROM;Both;10 reps;Supine Heel Slides: AAROM;Right;20 reps;Supine Hip ABduction/ADduction: AAROM;Right;15 reps;Supine   Assessment/Plan  PT Assessment Patient needs continued PT services  PT Problem List Decreased strength;Decreased range of motion;Decreased activity tolerance;Decreased balance;Decreased mobility;Decreased knowledge of use of DME;Pain       PT Treatment Interventions DME instruction;Gait training;Stair training;Functional mobility training;Therapeutic activities;Therapeutic exercise;Patient/family education    PT Goals (Current goals can be found in the Care Plan section)  Acute Rehab PT Goals Patient Stated Goal: home in next day or so PT  Goal Formulation: With patient Time For Goal Achievement: 10/02/16 Potential to Achieve Goals: Good    Frequency 7X/week   Barriers to discharge        Co-evaluation               AM-PAC PT "6 Clicks" Daily Activity  Outcome Measure Difficulty turning over in bed (including adjusting bedclothes, sheets and blankets)?: Total Difficulty moving from lying on back to sitting on the side of the bed? : Total Difficulty sitting down on and standing up from a chair with arms (e.g., wheelchair, bedside commode, etc,.)?: A Lot Help needed moving to and from a bed to chair (including a wheelchair)?: A Lot Help needed walking in hospital room?: A Little Help needed climbing 3-5 steps with a railing? : A Lot 6 Click Score: 11    End of Session Equipment Utilized During Treatment: Gait belt Activity Tolerance: Patient tolerated treatment well Patient left: in chair;with call bell/phone within reach Nurse Communication: Mobility status PT Visit Diagnosis: History of falling (Z91.81);Difficulty in walking, not elsewhere classified (R26.2)    Time: 3143-8887 PT Time Calculation (min) (ACUTE ONLY): 41 min   Charges:   PT Evaluation $PT Eval Low Complexity: 1 Procedure PT Treatments $Gait Training: 8-22 mins $Therapeutic Exercise: 8-22 mins   PT G Codes:        Pg [phone]   Lataya Varnell 09/28/2016, 12:14 PM

## 2016-09-28 NOTE — Evaluation (Signed)
Occupational Therapy Evaluation Patient Details Name: Joanne Edwards MRN: [id number] DOB: 12/13/1940 Today's Date: 09/28/2016    History of Present Illness Right total hip arthroplasty through direct anterior   Clinical Impression   OT education complete.  Pt will  Need a 3 n 1    Follow Up Recommendations  No OT follow up    Equipment Recommendations  3 in 1 bedside commode    Recommendations for Other Services       Precautions / Restrictions Precautions Precautions: None Restrictions Weight Bearing Restrictions: No      Mobility Bed Mobility               General bed mobility comments: pt in chair  Transfers Overall transfer level: Needs assistance Equipment used: Rolling walker (2 wheeled) Transfers: Sit to/from Stand;Stand Pivot Transfers Sit to Stand: Supervision Stand pivot transfers: Supervision       General transfer comment: VC for hand placement        ADL either performed or assessed with clinical judgement   ADL Overall ADL's : Needs assistance/impaired     Grooming: Standing;Supervision/safety   Upper Body Bathing: Set up;Sitting   Lower Body Bathing: Minimal assistance;Sit to/from stand;Cueing for sequencing;Cueing for compensatory techniques   Upper Body Dressing : Sitting;Set up   Lower Body Dressing: Minimal assistance;Cueing for sequencing;Cueing for safety;Cueing for compensatory techniques   Toilet Transfer: Supervision/safety;Comfort height toilet   Toileting- Clothing Manipulation and Hygiene: Supervision/safety   Tub/ Shower Transfer: Walk-in shower;Cueing for safety;Cueing for sequencing;Min Chief Executive Officer Details (indicate cue type and reason): VC for safety   General ADL Comments: Education provided regarding strategies for ADL activity s/p LTHA. Pt doing well !      Vision Patient Visual Report: No change from baseline              Pertinent Vitals/Pain Pain Assessment: 0-10 Pain Score: 2  Pain  Location: R hip Pain Descriptors / Indicators: Sore Pain Intervention(s): Monitored during session        Extremity/Trunk Assessment Upper Extremity Assessment Upper Extremity Assessment: Overall WFL for tasks assessed           Communication Communication Communication: No difficulties   Cognition Arousal/Alertness: Awake/alert Behavior During Therapy: WFL for tasks assessed/performed Overall Cognitive Status: Within Functional Limits for tasks assessed                                                Home Living Family/patient expects to be discharged to:: Private residence Living Arrangements: Spouse/significant other Available Help at Discharge: Family Type of Home: House       Home Layout: One level     Bathroom Shower/Tub: Occupational psychologist: Standard     Home Equipment: None          Prior Functioning/Environment Level of Independence: Independent                 OT Problem List:        OT Treatment/Interventions:      OT Goals(Current goals can be found in the care plan section) Acute Rehab OT Goals Patient Stated Goal: home in next day or so OT Goal Formulation: With patient  OT Frequency:                AM-PAC PT "6 Clicks" Daily Activity  Outcome Measure Help from another person eating meals?: None Help from another person taking care of personal grooming?: None Help from another person toileting, which includes using toliet, bedpan, or urinal?: None Help from another person bathing (including washing, rinsing, drying)?: A Little Help from another person to put on and taking off regular upper body clothing?: None Help from another person to put on and taking off regular lower body clothing?: A Little 6 Click Score: 22   End of Session Equipment Utilized During Treatment: Rolling walker Nurse Communication: Mobility status  Activity Tolerance: Patient tolerated treatment well;No increased  pain Patient left: in chair;with call bell/phone within reach  OT Visit Diagnosis: Muscle weakness (generalized) (M62.81)                Time: 0149-9692 OT Time Calculation (min): 24 min Charges:  OT General Charges $OT Visit: 1 Procedure OT Evaluation $OT Eval Moderate Complexity: 1 Procedure OT Treatments $Self Care/Home Management : 8-22 mins G-Codes:     Kari Baars, OT [phone]  Payton Mccallum D 09/28/2016, 11:45 AM

## 2016-09-28 NOTE — Care Management Note (Signed)
Case Management Note  Patient Details  Name: Joanne Edwards MRN: [id number] Date of Birth: Dec 28, 1940  Subjective/Objective:        Right THA            Action/Plan: Discharge Planning: NCM spoke to pt and offered choice for HH/list provided. Pt agreeable to [facility] for [facility] PT.  Pt states she will need RW and 3n1 for home. Contacted [facility] DME rep for equipment to be delivered to room prior to dc.   PCP Colon Branch MD   Expected Discharge Date:  09/30/16               Expected Discharge Plan:  [place]  In-House Referral:  NA  Discharge planning Services  CM Consult  Post Acute Care Choice:  Home Health Choice offered to:  Patient  DME Arranged:  3-N-1, Walker rolling DME Agency:  [place]:  PT [place] Agency:  [facility] (formerly [facility]  Status of Service:  Completed, signed off  If discussed at [employer] of Stay Meetings, dates discussed:    Additional Comments:  Erenest Rasher, RN 09/28/2016, 10:16 AM

## 2016-09-29 NOTE — Progress Notes (Signed)
Patient ID: Joanne Edwards, female   DOB: 09-24-1940, 76 y.o.   MRN: [id number] Doing well.  No acute changes.  Vitals stable.  Hip stable.  Can be discharged to home today.

## 2016-09-29 NOTE — Progress Notes (Signed)
PT Cancellation Note  Patient Details Name: Joanne Edwards MRN: [id number] DOB: 05-14-1940   Cancelled Treatment:    Reason Eval/Treat Not Completed: Pt discharged prior to PT session on today.    Weston Anna, MPT Pager: [phone]

## 2016-09-29 NOTE — Discharge Summary (Signed)
Patient ID: Joanne Edwards MRN: [id number] DOB/AGE: 12/01/1940 76 y.o.  Admit date: 09/27/2016 Discharge date: 09/29/2016  Admission Diagnoses:  Principal Problem:   Unilateral primary osteoarthritis, right hip Active Problems:   Status post total replacement of right hip   Discharge Diagnoses:  Same  Past Medical History:  Diagnosis Date  . Anxiety   . Arthritis   . Balance problem    going down steps  . Complication of anesthesia    patient does not want any spinal anesthesia  . Depression    lost a son 2011 (aneurysm)  . GERD (gastroesophageal reflux disease)   . Headache    hx of   . Hyperlipemia   . Peripheral vascular disease ([place])   . Polio    as a child--loss muscle of lower left leg  . Polio   . PONV (postoperative nausea and vomiting)     Surgeries: Procedure(s): RIGHT TOTAL HIP ARTHROPLASTY ANTERIOR APPROACH on 09/27/2016   Consultants:   Discharged Condition: Improved  Hospital Course: Joanne Edwards is an 76 y.o. female who was admitted 09/27/2016 for operative treatment ofUnilateral primary osteoarthritis, right hip. Patient has severe unremitting pain that affects sleep, daily activities, and work/hobbies. After pre-op clearance the patient was taken to the operating room on 09/27/2016 and underwent  Procedure(s): RIGHT TOTAL HIP ARTHROPLASTY ANTERIOR APPROACH.    Patient was given perioperative antibiotics: Anti-infectives    Start     Dose/Rate Route Frequency Ordered Stop   09/27/16 1600  ceFAZolin (ANCEF) IVPB 1 g/50 mL premix     1 g 100 mL/hr over 30 Minutes Intravenous Every 6 hours 09/27/16 1514 09/27/16 2330   09/27/16 0855  ceFAZolin (ANCEF) IVPB 2g/100 mL premix     2 g 200 mL/hr over 30 Minutes Intravenous On call to O.R. 09/27/16 0855 09/27/16 1109       Patient was given sequential compression devices, early ambulation, and chemoprophylaxis to prevent DVT.  Patient benefited maximally from hospital stay and there were no complications.     Recent vital signs: Patient Vitals for the past 24 hrs:  BP Temp Temp src Pulse Resp SpO2  09/29/16 0540 (!) 114/47 98.5 F (36.9 C) Oral 79 16 96 %  09/28/16 2234 (!) 111/51 98.3 F (36.8 C) Oral 80 18 94 %     Recent laboratory studies:  Recent Labs  09/28/16 0453  WBC 9.2  HGB 10.0*  HCT 29.2*  PLT 180  NA 140  K 4.0  CL 104  CO2 28  BUN 16  CREATININE 0.72  GLUCOSE 130*  CALCIUM 8.7*     Discharge Medications:   Allergies as of 09/29/2016   No Known Allergies     Medication List    TAKE these medications   aspirin 81 MG chewable tablet Chew 1 tablet (81 mg total) by mouth 2 (two) times daily.   CALCIUM + D PO Take 1 tablet by mouth 2 (two) times daily.   celecoxib 100 MG capsule Commonly known as:  CELEBREX Take 1 capsule (100 mg total) by mouth 2 (two) times daily as needed.   clonazePAM 0.5 MG tablet Commonly known as:  KLONOPIN Take 1 tablet (0.5 mg total) by mouth as needed. What changed:  when to take this  reasons to take this   methocarbamol 500 MG tablet Commonly known as:  ROBAXIN Take 1 tablet (500 mg total) by mouth every 6 (six) hours as needed for muscle spasms.   multivitamin with minerals Tabs  tablet Take 1 tablet by mouth daily.   omeprazole 20 MG capsule Commonly known as:  PRILOSEC Take 1 capsule (20 mg total) by mouth 2 (two) times daily before a meal. What changed:  when to take this   oxyCODONE-acetaminophen 5-325 MG tablet Commonly known as:  ROXICET Take 1-2 tablets by mouth every 4 (four) hours as needed.   rosuvastatin 20 MG tablet Commonly known as:  CRESTOR Take 1 tablet (20 mg total) by mouth at bedtime.   sertraline 100 MG tablet Commonly known as:  ZOLOFT Take 1 to 1 1/2 tablets as needed daily. What changed:  how much to take  how to take this  when to take this  additional instructions   topiramate 50 MG tablet Commonly known as:  TOPAMAX Take 1 tablet (50 mg total) by mouth at bedtime.    Vitamin D3 1000 units Caps Take 1,000 Units by mouth daily.            Durable Medical Equipment        Start     Ordered   09/27/16 1515  DME 3 n 1  Once     09/27/16 1514   09/27/16 1515  DME Walker rolling  Once    Question:  Patient needs a walker to treat with the following condition  Answer:  Status post total replacement of right hip   09/27/16 1514      Diagnostic Studies: Dg Pelvis Portable  Result Date: 09/27/2016 CLINICAL DATA:  Post RIGHT hip replacement EXAM: PORTABLE PELVIS 1-2 VIEWS COMPARISON:  Portable exam at 1326 hrs compared to 07/24/2016 FINDINGS: Components of RIGHT hip prosthesis identified in expected position. Bones demineralized. No fracture, dislocation or bone destruction. Scattered pelvic phleboliths. Postsurgical changes of the soft tissues lateral to the RIGHT hip. IMPRESSION: RIGHT hip prosthesis without acute complication. Electronically Signed   By: Lavonia Dana M.D.   On: 09/27/2016 13:38   Dg C-arm 1-60 Min-no Report  Result Date: 09/27/2016 Fluoroscopy was utilized by the requesting physician.  No radiographic interpretation.   Dg Hip Operative Unilat With Pelvis Right  Result Date: 09/27/2016 FLUOROSCOPY TIME:  5 minutes C-arm fluoroscopic images were obtained intraoperatively and submitted for post operative interpretation. Please see the performing provider's procedural report for the fluoroscopy time utilized. EXAM: OPERATIVE RIGHT HIP (WITH PELVIS IF PERFORMED) VIEWS; RF-DG C-ARM 1-60 MIN TECHNIQUE: Fluoroscopic spot image(s) were submitted for interpretation post-operatively. COMPARISON:  None. FINDINGS: Two AP views of the right hip demonstrate that the acetabular and femoral components of the hip prosthesis appear in excellent position in the AP projection. IMPRESSION: Satisfactory appearance of the right hip in the AP projection after total hip prosthesis insertion. Electronically Signed   By: Lorriane Shire M.D.   On: 09/27/2016 12:05     Disposition: to home  Discharge Instructions    Discharge patient    Complete by:  As directed    Discharge disposition:  01-Home or Self Care   Discharge patient date:  09/29/2016      Follow-up Information    [facility] Follow up.   Specialty:  [place] Why:  Home Health Physical Therapy Contact information: [place] [address] [phone]        Mcarthur Rossetti, MD Follow up in 2 week(s).   Specialty:  Orthopedic Surgery Contact information: [place] [address] [phone]            Signed:  Mcarthur Rossetti 09/29/2016, 8:45 AM

## 2016-09-29 NOTE — Progress Notes (Signed)
Discharged from floor via w/c for transport home by car. Spouse & belongings with pt. No changes in assessment. Joanne Edwards  

## 2016-09-30 DIAGNOSIS — F329 Major depressive disorder, single episode, unspecified: Secondary | ICD-10-CM | POA: Diagnosis not present

## 2016-09-30 DIAGNOSIS — I83893 Varicose veins of bilateral lower extremities with other complications: Secondary | ICD-10-CM | POA: Diagnosis not present

## 2016-09-30 DIAGNOSIS — Z471 Aftercare following joint replacement surgery: Secondary | ICD-10-CM | POA: Diagnosis not present

## 2016-09-30 DIAGNOSIS — F419 Anxiety disorder, unspecified: Secondary | ICD-10-CM | POA: Diagnosis not present

## 2016-09-30 DIAGNOSIS — M150 Primary generalized (osteo)arthritis: Secondary | ICD-10-CM | POA: Diagnosis not present

## 2016-09-30 DIAGNOSIS — I739 Peripheral vascular disease, unspecified: Secondary | ICD-10-CM | POA: Diagnosis not present

## 2016-10-02 DIAGNOSIS — F329 Major depressive disorder, single episode, unspecified: Secondary | ICD-10-CM | POA: Diagnosis not present

## 2016-10-02 DIAGNOSIS — Z471 Aftercare following joint replacement surgery: Secondary | ICD-10-CM | POA: Diagnosis not present

## 2016-10-02 DIAGNOSIS — M150 Primary generalized (osteo)arthritis: Secondary | ICD-10-CM | POA: Diagnosis not present

## 2016-10-02 DIAGNOSIS — I739 Peripheral vascular disease, unspecified: Secondary | ICD-10-CM | POA: Diagnosis not present

## 2016-10-02 DIAGNOSIS — F419 Anxiety disorder, unspecified: Secondary | ICD-10-CM | POA: Diagnosis not present

## 2016-10-02 DIAGNOSIS — I83893 Varicose veins of bilateral lower extremities with other complications: Secondary | ICD-10-CM | POA: Diagnosis not present

## 2016-10-04 ENCOUNTER — Encounter: Payer: Self-pay | Admitting: Neurology

## 2016-10-08 ENCOUNTER — Ambulatory Visit: Payer: Commercial Managed Care - HMO | Admitting: Neurology

## 2016-10-10 ENCOUNTER — Ambulatory Visit (INDEPENDENT_AMBULATORY_CARE_PROVIDER_SITE_OTHER): Payer: Medicare HMO | Admitting: Orthopaedic Surgery

## 2016-10-10 DIAGNOSIS — Z96641 Presence of right artificial hip joint: Secondary | ICD-10-CM

## 2016-10-10 MED ORDER — OXYCODONE-ACETAMINOPHEN 5-325 MG PO TABS: 1.0000 | ORAL_TABLET | Freq: Four times a day (QID) | ORAL | 0 refills | Status: DC | PRN

## 2016-10-10 NOTE — Progress Notes (Signed)
The patient is 2 weeks tomorrow status post a right total hip arthroplasty. She says she is doing well. She already dismissed home health therapy. She's been her aspirin 81 mg twice a day and she does need a refill on her Percocet. She family with a walker and doing well.  On examination incision looks great. I placed new Steri-Strips on the right hip incision. She does have a moderate seroma and I drained 70 mL of serosanguineous fluid from this. There is no evidence infection. Her leg lengths are equal.  All questions were encouraged and answered. We'll have her slowly increase her activities as she is comfortable. Surgery transition from walker to a cane to nothing as comfort and balance and coordination allow this. All questions were encouraged and answered. I did refill her pain medicine cancer about this as well. We'll see her back in 4 weeks to see how she doing overall but no x-rays are needed.

## 2016-11-07 ENCOUNTER — Ambulatory Visit (INDEPENDENT_AMBULATORY_CARE_PROVIDER_SITE_OTHER): Payer: Medicare HMO | Admitting: Orthopaedic Surgery

## 2016-11-07 DIAGNOSIS — Z96641 Presence of right artificial hip joint: Secondary | ICD-10-CM

## 2016-11-07 NOTE — Progress Notes (Signed)
The patient is now 41 day status post a right total hip arthroplasty. She says she is doing excellent gr is very happy.  On exam patient she has excellent range motion of her hip. I did later flat she is actually just a few millimeters long on the right compared to the right and left. She says she does notice that difference.  I want to send her to biotech to consider shoe insert for buildup to help with her balance. I'll see her back in a month see how she doing overall.

## 2016-12-04 ENCOUNTER — Ambulatory Visit (INDEPENDENT_AMBULATORY_CARE_PROVIDER_SITE_OTHER): Payer: Medicare HMO | Admitting: Internal Medicine

## 2016-12-10 ENCOUNTER — Ambulatory Visit (INDEPENDENT_AMBULATORY_CARE_PROVIDER_SITE_OTHER): Payer: Medicare HMO | Admitting: Orthopaedic Surgery

## 2016-12-10 DIAGNOSIS — Z96641 Presence of right artificial hip joint: Secondary | ICD-10-CM

## 2016-12-10 NOTE — Progress Notes (Signed)
The patient is now getting close to 3 months status post a right total hip arthroplasty. She is really about 9-10 weeks now since that surgery. She's doing well except for likely discrepancy. She is actually been to a Clinical research associate and they're making inserts for her shoes. She says she is happy about this and overall she feels like she is doing well and is just a little bit of stiffness when sitting for long period time.  She is walking with a more balance gait at this point. She tolerates me easily putting her right hip the range of motion as well.  At this point she'll continue increase her activities as she is comfortable. I like to see her back in 6 months with a low AP pelvis.

## 2016-12-11 ENCOUNTER — Encounter: Payer: Self-pay | Admitting: Internal Medicine

## 2016-12-11 ENCOUNTER — Ambulatory Visit (INDEPENDENT_AMBULATORY_CARE_PROVIDER_SITE_OTHER): Payer: Medicare HMO | Admitting: Internal Medicine

## 2016-12-11 VITALS — BP 126/70 | HR 70 | Temp 98.0°F | Resp 14 | Ht 64.0 in | Wt 187.1 lb

## 2016-12-11 DIAGNOSIS — E785 Hyperlipidemia, unspecified: Secondary | ICD-10-CM | POA: Diagnosis not present

## 2016-12-11 DIAGNOSIS — M1990 Unspecified osteoarthritis, unspecified site: Secondary | ICD-10-CM

## 2016-12-11 DIAGNOSIS — F418 Other specified anxiety disorders: Secondary | ICD-10-CM

## 2016-12-11 NOTE — Patient Instructions (Signed)
  GO TO THE FRONT DESK Schedule your next appointment for a  Physical exam in 3-4 months, fasting

## 2016-12-11 NOTE — Progress Notes (Signed)
Pre visit review using our clinic review tool, if applicable. No additional management support is needed unless otherwise documented below in the visit note. 

## 2016-12-11 NOTE — Assessment & Plan Note (Addendum)
Hyperlipidemia: Refill Crestor Depression: Overall feels well, has decreased sertraline to 100 mg daily (from 150 mg qd). On Clonazepam as needed. Migraines: Self d/c Topamax few months ago, no headaches recently. Restart prn DJD: Recovering well from recent hip replacement.  Overall she seems to be doing very well compared to a few months ago, I wonder if chronic hip pain was "bringing her down". Recommend a flu shot this fall RTC 3-4 months, CPX

## 2016-12-11 NOTE — Progress Notes (Signed)
Subjective:    Patient ID: Joanne Edwards, female    DOB: 1940/10/07, 76 y.o.   MRN: [id number]  DOS:  12/11/2016 Type of visit - description : rov Interval history: DJD: Recovering well from recent hip surgery. Headaches: Not taking Topamax for the last 6 months, no recent episodes. Anxiety depression: Has decreased in amount of Zoloft she needs. High cholesterol: Needs a refill. Labs reviewed: Not due for any blood work today. Medication list reviewed and edited   Review of Systems   Past Medical History:  Diagnosis Date  . Anxiety   . Arthritis   . Balance problem    going down steps  . Complication of anesthesia    patient does not want any spinal anesthesia  . Depression    lost a son 2011 (aneurysm)  . GERD (gastroesophageal reflux disease)   . Headache    hx of   . Hyperlipemia   . Peripheral vascular disease ([place])   . Polio    as a child--loss muscle of lower left leg  . Polio   . PONV (postoperative nausea and vomiting)     Past Surgical History:  Procedure Laterality Date  . BREAST CYST EXCISION Left   . BREAST SURGERY     reduction  . cysts removed from bilateral ankles     . ear pinned back     . EYE SURGERY     "fat removed from under R eye"  . OOPHORECTOMY Bilateral    was removed b/c mother hx of ovarian cancer  . REDUCTION MAMMAPLASTY    . REPLACEMENT TOTAL KNEE  2006   right knee  . TONSILLECTOMY    . TOTAL HIP ARTHROPLASTY Right 09/27/2016   Procedure: RIGHT TOTAL HIP ARTHROPLASTY ANTERIOR APPROACH;  Surgeon: Mcarthur Rossetti, MD;  Location: [facility];  Service: Orthopedics;  Laterality: Right;  . tummy tuck  1997    Social History   Social History  . Marital status: Married    Spouse name: N/A  . Number of children: 2  . Years of education: N/A   Occupational History  . Retired Therapist, sports    Social History Main Topics  . Smoking status: Never Smoker  . Smokeless tobacco: Never Used  . Alcohol use No  . Drug use: No  . Sexual  activity: Yes   Other Topics Concern  . Not on file   Social History Narrative   Household- pt and husband (58 years)   Lost 1 of her 2 sons (2011)      Allergies as of 12/11/2016   No Known Allergies     Medication List       Accurate as of 12/11/16  5:39 PM. Always use your most recent med list.          CALCIUM + D PO Take 1 tablet by mouth 2 (two) times daily.   celecoxib 100 MG capsule Commonly known as:  CELEBREX Take 1 capsule (100 mg total) by mouth 2 (two) times daily as needed.   clonazePAM 0.5 MG tablet Commonly known as:  KLONOPIN Take 1 tablet (0.5 mg total) by mouth as needed.   multivitamin with minerals Tabs tablet Take 1 tablet by mouth daily.   omeprazole 20 MG capsule Commonly known as:  PRILOSEC Take 1 capsule (20 mg total) by mouth 2 (two) times daily before a meal.   rosuvastatin 20 MG tablet Commonly known as:  CRESTOR Take 1 tablet (20 mg total) by mouth at  bedtime.   sertraline 100 MG tablet Commonly known as:  ZOLOFT Take 100 mg by mouth daily.   Vitamin D3 1000 units Caps Take 1,000 Units by mouth daily.          Objective:   Physical Exam BP 126/70 (BP Location: Left Arm, Patient Position: Sitting, Cuff Size: Normal)   Pulse 70   Temp 98 F (36.7 C) (Oral)   Resp 14   Ht 5\' 4"  (1.626 m)   Wt 187 lb 2 oz (84.9 kg)   SpO2 97%   BMI 32.12 kg/m  General:   Well developed, well nourished . NAD.  HEENT:  Normocephalic . Face symmetric, atraumatic Lungs:  CTA B Normal respiratory effort, no intercostal retractions, no accessory muscle use. Heart: RRR,  no murmur.  No pretibial edema bilaterally  Skin: Not pale. Not jaundice Neurologic:  alert & oriented X3.  Speech normal, gait appropriate for age and unassisted. Still somewhat limited by DJD Psych--  Cognition and judgment appear intact.  Cooperative with normal attention span and concentration.  Behavior appropriate. No anxious or depressed appearing.        Assessment & Plan:   Assessment  Hyperlipidemia -- changed pravachol to crestor 02-2016  Depression, lost a son 2011 (dt aneurysm) Migraines (sees Dr Tomi Likens ) GERD MSK: -DJD -Polio, decreased muscle mass left leg -Gait imbalance Varicose veins +FH Ovarian ca, status post bilateral oophorectomy Thyroid cyst, complex per ----> [place] 02-2015: + complex cysts, no criteria for bx ; [place] 03-2016: no criteria for bx  (sse: care everywhere)  PLAN Hyperlipidemia: Refill Crestor Depression: Overall feels well, has decreased sertraline to 100 mg daily (from 150 mg qd). On Clonazepam as needed. Migraines: Self d/c Topamax few months ago, no headaches recently. Restart prn DJD: Recovering well from recent hip replacement.  Overall she seems to be doing very well compared to a few months ago, I wonder if chronic hip pain was "bringing her down". Recommend a flu shot this fall RTC 3-4 months, CPX

## 2016-12-26 ENCOUNTER — Ambulatory Visit (INDEPENDENT_AMBULATORY_CARE_PROVIDER_SITE_OTHER): Payer: Medicare HMO

## 2016-12-26 ENCOUNTER — Ambulatory Visit (INDEPENDENT_AMBULATORY_CARE_PROVIDER_SITE_OTHER): Payer: Medicare HMO | Admitting: Orthopaedic Surgery

## 2016-12-26 DIAGNOSIS — Z96641 Presence of right artificial hip joint: Secondary | ICD-10-CM

## 2016-12-26 NOTE — Progress Notes (Signed)
                                                                                                                                                                                                                                                                             The patient is now 3 months status post a right total hip arthroplasty through direct anterior approach. She is walking without assistive device. She is doing well overall. She has had some low back pain and pain to the right sacral area just recently. However she has been helping her husband who is recently been diagnosed with an L2 compression fracture that's put some strain on her back. She denies any pain in her hip itself.  On examination of her hip I can move her right hip around easily without any difficulty at all. Her pain seems to be more the paraspinal muscles to the right side of her low back in this appears to be more muscular skeletal strain.  X-rays of the pelvis and right hip show well-seated implant with no, getting features.  I recommended over-the-counter anti-inflammatories as well as topical medication such as Biofreeze and recommended potentially do massage therapy or physical therapy if it worsens. She will let us know. Otherwise I do not see her back for 6 months. I like an AP and lateral of her right hip at that visit.

## 2017-01-10 DIAGNOSIS — H25013 Cortical age-related cataract, bilateral: Secondary | ICD-10-CM | POA: Diagnosis not present

## 2017-01-10 DIAGNOSIS — D3132 Benign neoplasm of left choroid: Secondary | ICD-10-CM | POA: Diagnosis not present

## 2017-01-10 DIAGNOSIS — H04123 Dry eye syndrome of bilateral lacrimal glands: Secondary | ICD-10-CM | POA: Diagnosis not present

## 2017-01-10 DIAGNOSIS — H2513 Age-related nuclear cataract, bilateral: Secondary | ICD-10-CM | POA: Diagnosis not present

## 2017-01-15 ENCOUNTER — Telehealth (INDEPENDENT_AMBULATORY_CARE_PROVIDER_SITE_OTHER): Payer: Self-pay | Admitting: Radiology

## 2017-01-15 DIAGNOSIS — Z96641 Presence of right artificial hip joint: Secondary | ICD-10-CM

## 2017-01-15 NOTE — Telephone Encounter (Signed)
I would definitely send her to outpatient physical therapy [facility] in [place] in order to work on stretching the muscles around her hip as well as strengthening the hip muscles. They can work on balance and coordination is well.

## 2017-01-15 NOTE — Telephone Encounter (Signed)
Please advise  Doesn't look like our last dictation is in there

## 2017-01-15 NOTE — Telephone Encounter (Signed)
Patient states that she spoke with Dr. Ninfa Linden about possible physical therapy due to the fact the muscles around her hip felt tight when she first got up and started going. She would like to have a RX for therapy sent to [facility] in [place] if possible.   Please call patient back to advise.

## 2017-01-15 NOTE — Telephone Encounter (Signed)
Referral to PT entered. Patient advised.

## 2017-01-15 NOTE — Addendum Note (Signed)
Addended by: Meyer Cory on: 01/15/2017 04:38 PM   Modules accepted: Orders

## 2017-01-16 ENCOUNTER — Encounter: Payer: Self-pay | Admitting: Rehabilitative and Restorative Service Providers"

## 2017-01-16 ENCOUNTER — Ambulatory Visit: Payer: Self-pay

## 2017-01-16 ENCOUNTER — Ambulatory Visit (INDEPENDENT_AMBULATORY_CARE_PROVIDER_SITE_OTHER): Payer: Medicare HMO | Admitting: Rehabilitative and Restorative Service Providers"

## 2017-01-16 DIAGNOSIS — R2689 Other abnormalities of gait and mobility: Secondary | ICD-10-CM | POA: Diagnosis not present

## 2017-01-16 DIAGNOSIS — M25551 Pain in right hip: Secondary | ICD-10-CM

## 2017-01-16 DIAGNOSIS — R29898 Other symptoms and signs involving the musculoskeletal system: Secondary | ICD-10-CM | POA: Diagnosis not present

## 2017-01-16 NOTE — Patient Instructions (Addendum)
Self massage using ~ 3 inch plastic bal  Quads / HF, Prone KNEE: Quadriceps - Prone    Place strap around ankle. Bring ankle toward buttocks. Press hip into surface. Hold 30 seconds. Repeat 3 times per session. Do 2-3 sessions per day.   Strengthening: Hip Extension - Resisted    With tubing around right ankle, face anchor and pull leg straight back. Repeat __10__ times per set. Do _1-2___ sets per session. Do __2__ sessions per day.    Strengthening: Hip Abduction - Resisted    With tubing around right leg, other side toward anchor, extend leg out from side. Repeat __10__ times per set. Do __1-2__ sets per session. Do __2__ sessions per day.   TENS UNIT: This is helpful for muscle pain and spasm.   Search and Purchase a TENS 7000 2nd edition at www.tenspros.com. It should be less than $30.     TENS unit instructions: Do not shower or bathe with the unit on Turn the unit off before removing electrodes or batteries If the electrodes lose stickiness add a drop of water to the electrodes after they are disconnected from the unit and place on plastic sheet. If you continued to have difficulty, call the TENS unit company to purchase more electrodes. Do not apply lotion on the skin area prior to use. Make sure the skin is clean and dry as this will help prolong the life of the electrodes. After use, always check skin for unusual red areas, rash or other skin difficulties. If there are any skin problems, does not apply electrodes to the same area. Never remove the electrodes from the unit by pulling the wires. Do not use the TENS unit or electrodes other than as directed. Do not change electrode placement without consultating your therapist or physician. Keep 2 fingers with between each electrode.   Trigger Point Dry Needling  . What is Trigger Point Dry Needling (DN)? o DN is a physical therapy technique used to treat muscle pain and dysfunction. Specifically, DN helps  deactivate muscle trigger points (muscle knots).  o A thin filiform needle is used to penetrate the skin and stimulate the underlying trigger point. The goal is for a local twitch response (LTR) to occur and for the trigger point to relax. No medication of any kind is injected during the procedure.   . What Does Trigger Point Dry Needling Feel Like?  o The procedure feels different for each individual patient. Some patients report that they do not actually feel the needle enter the skin and overall the process is not painful. Very mild bleeding may occur. However, many patients feel a deep cramping in the muscle in which the needle was inserted. This is the local twitch response.   Marland Kitchen How Will I feel after the treatment? o Soreness is normal, and the onset of soreness may not occur for a few hours. Typically this soreness does not last longer than two days.  o Bruising is uncommon, however; ice can be used to decrease any possible bruising.  o In rare cases feeling tired or nauseous after the treatment is normal. In addition, your symptoms may get worse before they get better, this period will typically not last longer than 24 hours.   . What Can I do After My Treatment? o Increase your hydration by drinking more water for the next 24 hours. o You may place ice or heat on the areas treated that have become sore, however, do not use heat on inflamed  or bruised areas. Heat often brings more relief post needling. o You can continue your regular activities, but vigorous activity is not recommended initially after the treatment for 24 hours. o DN is best combined with other physical therapy such as strengthening, stretching, and other therapies.

## 2017-01-16 NOTE — Therapy (Addendum)
[facility]  [place], [place], [address] Phone: [phone]   Fax:  [phone]  Physical Therapy Evaluation  Patient Details  Name: QUEENIE AUFIERO MRN: [id number] Date of Birth: 1941/01/21 Referring Provider: Dr Jean Rosenthal   Encounter Date: 01/16/2017      PT End of Session - 01/16/17 0817    Visit Number 1   Number of Visits 12   Date for PT Re-Evaluation 02/26/17   PT Start Time 0830   PT Stop Time 0929   PT Time Calculation (min) 59 min   Activity Tolerance Patient tolerated treatment well      Past Medical History:  Diagnosis Date  . Anxiety   . Arthritis   . Balance problem    going down steps  . Complication of anesthesia    patient does not want any spinal anesthesia  . Depression    lost a son 2011 (aneurysm)  . GERD (gastroesophageal reflux disease)   . Headache    hx of   . Hyperlipemia   . Peripheral vascular disease ([place])   . Polio    as a child--loss muscle of lower left leg  . Polio   . PONV (postoperative nausea and vomiting)     Past Surgical History:  Procedure Laterality Date  . BREAST CYST EXCISION Left   . BREAST SURGERY     reduction  . cysts removed from bilateral ankles     . ear pinned back     . EYE SURGERY     "fat removed from under R eye"  . OOPHORECTOMY Bilateral    was removed b/c mother hx of ovarian cancer  . REDUCTION MAMMAPLASTY    . REPLACEMENT TOTAL KNEE  2006   right knee  . TONSILLECTOMY    . TOTAL HIP ARTHROPLASTY Right 09/27/2016   Procedure: RIGHT TOTAL HIP ARTHROPLASTY ANTERIOR APPROACH;  Surgeon: Mcarthur Rossetti, MD;  Location: [facility];  Service: Orthopedics;  Laterality: Right;  . tummy tuck  1997    There were no vitals filed for this visit.       Subjective Assessment - 01/16/17 0834    Subjective Patient reports that she underwent Rt THA 09/27/16 with good recovery. She has noticed leg length difference which has been  addressed with shoe lift. She now has significant pain in the Rt anterior hip area.    Pertinent History Hx of Rt TKA 2005; artihritis; polio as a child(age 76) Lt LE residual weakness; leg length difference wears 1/2 inch lift Lt shoe    How long can you sit comfortably? no limit - pain moving from sit to stand   How long can you stand comfortably? 10 min    How long can you walk comfortably? 20-30 min level surfaces    Diagnostic tests xrays    Patient Stated Goals get rid of anterior hip pain    Currently in Pain? Yes   Pain Score 7   with movement - only lasts until she gets going    Pain Location Hip   Pain Orientation Right;Anterior   Pain Descriptors / Indicators Aching   Pain Type Surgical pain   Pain Onset More than a month ago   Pain Frequency Intermittent   Aggravating Factors  moving sit to stand and when initially standing; lying/sleeping in one positions; turning in bed; reaching   Pain Relieving Factors move; heat; muscle relaxants  [facility] PT Assessment - 01/16/17 0001      Assessment   Medical Diagnosis s/p Rt THA   Referring Provider Dr Jean Rosenthal    Onset Date/Surgical Date 09/27/16   Hand Dominance Right   Next MD Visit PRN    Prior Therapy yes in the hospital/home health     Precautions   Precautions None     Balance Screen   Has the patient fallen in the past 6 months No   Has the patient had a decrease in activity level because of a fear of falling?  No   Is the patient reluctant to leave their home because of a fear of falling?  No     Home Environment   Additional Comments multilevel home has a chair lift - stays on one level. 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    Leisure household chores; travel      Observation/Other Assessments   Focus on Therapeutic Outcomes (FOTO)  59% limitation      Sensation    Additional Comments WFL's per pt report      Posture/Postural Control   Posture Comments head forward; shoulders rounded and elevated; flexed forward at hips and trunk      AROM   Right/Left Hip --  limited hip ext bilat Rt > Lt    Right/Left Knee --  WFL's    Right/Left Ankle --  WFL's      Strength   Right Hip Flexion 5/5   Right Hip Extension 4+/5   Right Hip ABduction 5/5   Left Hip Flexion 5/5   Left Hip Extension 5/5   Left Hip ABduction 5/5   Right/Left Knee --  5/5 bilat flex/ext   Right Ankle Dorsiflexion 5/5   Left Ankle Dorsiflexion 4/5     Flexibility   Hamstrings WNL's   Quadriceps tight bilat Rt > Lt    ITB tight Rt > Lt    Piriformis tight Rt      Palpation   Palpation comment muscular tightness Rt psoas; hip adductors; quads; TFL; ITB      Ambulation/Gait   Gait Comments ambulates with flexed posture; trunk and hips flexed forward; limp during Wt bearing Rt LE with Rt hip remaining flexed through stance phase - uses SPC             Objective measurements completed on examination: See above findings.          [place] Adult PT Treatment/Exercise - 01/16/17 0001      Therapeutic Activites    Therapeutic Activities --  myofacial ball release work      Knee/Hip Exercises: Hydrologist Right;3 reps;30 seconds  supine with strap    Quad Stretch 3 reps;30 seconds;Right  prone with strap    Hip Flexor Stretch 2 reps;30 seconds;Right  seated     Knee/Hip Exercises: Standing   Hip Abduction AROM;Stengthening;Right;Left;10 reps  leading with heel   Hip Extension AROM;Stengthening;Right;Left;10 reps     Moist Heat Therapy   Number Minutes Moist Heat 20 Minutes   Moist Heat Location Hip  Rt anterior lateral     Electrical Stimulation   Electrical Stimulation Location Rt anterior hip to lateral hip to hip adductors    Electrical Stimulation Action IFC   Electrical Stimulation Parameters to tolerance   Electrical  Stimulation Goals Pain;Tone  PT Education - 02/06/2017 0915    Education provided Yes   Education Details HEP TENS DN   Person(s) Educated Patient   Methods Explanation;Demonstration;Tactile cues;Verbal cues;Handout   Comprehension Verbalized understanding;Returned demonstration;Verbal cues required;Tactile cues required             PT Long Term Goals - 2017-02-06 1323      PT LONG TERM GOAL #1   Title Improve tissue extensibility Rt hip allowing patient to fuly extend hip in standing and for functional activities 02/26/17   Time 6   Period Weeks   Status New     PT LONG TERM GOAL #2   Title Decrease pain by 75% with functional activities including sit to stand and walking 02/26/17   Time 6   Period Weeks   Status New     PT LONG TERM GOAL #3   Title Improve gait pattern with least restrictive device to no device for gait 02/26/17   Time 6   Period Weeks   Status New     PT LONG TERM GOAL #4   Title Independent HEP 02/26/17   Time 6   Period Weeks   Status New     PT LONG TERM GOAL #5   Title Improve FOTO to </= 42% limitation 02/26/17   Time 6   Period Weeks   Status New                Plan - February 06, 2017 1318    Clinical Impression Statement Gerene presents with persistent Rt hip pain following THA 09/27/16. She has pain with functional activities including moving sit to stand and with walking especially the initial steps. She has abnormal posture with forward flexed trunk and hips; limited hip mobility; decreased strength; myofacial/muscular tightness through the Rt pspas; hip flexors/adductors; TFL; ITB into posterior hip piriformis/hip abductor musculature. Amera will benefit from PT to address problems identified.    History and Personal Factors relevant to plan of care: polio as a child with residual Lt LE weakness in ankle; leg length difference - 1/2 inch lift on Lt shoe    Clinical Presentation Stable   Clinical Decision Making Low    Rehab Potential Good   PT Frequency 2x / week   PT Duration 6 weeks   PT Treatment/Interventions Patient/family education;ADLs/Self Care Home Management;Cryotherapy;Electrical Stimulation;Iontophoresis 4mg /ml Dexamethasone;Moist Heat;Ultrasound;Dry needling;Manual techniques;Therapeutic activities;Therapeutic exercise;Neuromuscular re-education   PT Next Visit Plan review HEP; add manual work Rt anterior/lateral hip; continue with stretching and postural correction; modalities as indicated. Interested in dry needling.    Consulted and Agree with Plan of Care Patient      Patient will benefit from skilled therapeutic intervention in order to improve the following deficits and impairments:  Postural dysfunction, Improper body mechanics, Pain, Increased fascial restricitons, Increased muscle spasms, Decreased strength, Abnormal gait, Decreased activity tolerance  Visit Diagnosis: Pain in right hip - Plan: PT plan of care cert/re-cert  Other symptoms and signs involving the musculoskeletal system - Plan: PT plan of care cert/re-cert  Other abnormalities of gait and mobility - Plan: PT plan of care cert/re-cert      [facility] PT PB G-CODES - 02/06/2017 1326    Functional Assessment Tool Used  Evaluation; FOTO; clinical assessment    Functional Limitations Mobility: Walking and moving around   Mobility: Walking and Moving Around Current Status At least 60 percent but less than 80 percent impaired, limited or restricted   Mobility: Walking and Moving Around Goal Status [phone]) At least  40 percent but less than 60 percent impaired, limited or restricted       Problem List Patient Active Problem List   Diagnosis Date Noted  . DJD (degenerative joint disease) 12/11/2016  . Status post total replacement of right hip 09/27/2016  . Unilateral primary osteoarthritis, right hip 07/24/2016  . Trochanteric bursitis, right hip 07/24/2016  . Urinary incontinence 02/06/2016  . PCP NOTES >>>>>> 02/22/2015   . Thyroid cyst 02/21/2015  . Pain and swelling of left lower leg 05/17/2014  . Annual physical exam 01/06/2014  . Left leg weakness 09/28/2013  . GERD (gastroesophageal reflux disease) 07/02/2013  . Hyperlipidemia 07/02/2013  . Depression with anxiety 07/02/2013  . Varicose veins of lower extremities with other complications 15/94/5859  . Special screening for malignant neoplasms, colon 10/14/2012    Octavius Shin Nilda Simmer PT, MPH  01/16/2017, 4:31 PM  [facility] [place], [place], [address] Phone: [phone]   Fax:  [phone]  Name: SANAM MARMO MRN: [id number] Date of Birth: December 20, 1940

## 2017-01-17 ENCOUNTER — Ambulatory Visit (INDEPENDENT_AMBULATORY_CARE_PROVIDER_SITE_OTHER): Payer: Medicare HMO | Admitting: Behavioral Health

## 2017-01-17 ENCOUNTER — Ambulatory Visit: Payer: Self-pay | Admitting: Internal Medicine

## 2017-01-17 DIAGNOSIS — Z23 Encounter for immunization: Secondary | ICD-10-CM

## 2017-01-17 NOTE — Progress Notes (Signed)
Pre visit review using our clinic review tool, if applicable. No additional management support is needed unless otherwise documented below in the visit note.  Patient came in clinic for influenza vaccination. IM injection was given in the left deltoid. Patient tolerated the injection well. No signs or symptoms of a reaction before leaving the nurse visit.

## 2017-01-20 ENCOUNTER — Ambulatory Visit (INDEPENDENT_AMBULATORY_CARE_PROVIDER_SITE_OTHER): Payer: Medicare HMO | Admitting: Physical Therapy

## 2017-01-20 DIAGNOSIS — M25551 Pain in right hip: Secondary | ICD-10-CM | POA: Diagnosis not present

## 2017-01-20 DIAGNOSIS — R29898 Other symptoms and signs involving the musculoskeletal system: Secondary | ICD-10-CM

## 2017-01-20 DIAGNOSIS — R2689 Other abnormalities of gait and mobility: Secondary | ICD-10-CM | POA: Diagnosis not present

## 2017-01-20 NOTE — Therapy (Signed)
[place], [place], [address] Phone: [phone]   Fax:  [phone]  Physical Therapy Treatment  Patient Details  Name: Joanne Edwards MRN: [id number] Date of Birth: 07-Jan-1941 Referring Provider: Dr Jean Rosenthal   Encounter Date: 01/20/2017      PT End of Session - 01/20/17 1015    Visit Number 2   Number of Visits 12   Date for PT Re-Evaluation 02/26/17   PT Start Time 3428   PT Stop Time 1116   PT Time Calculation (min) 61 min   Activity Tolerance Patient tolerated treatment well      Past Medical History:  Diagnosis Date  . Anxiety   . Arthritis   . Balance problem    going down steps  . Complication of anesthesia    patient does not want any spinal anesthesia  . Depression    lost a son 2011 (aneurysm)  . GERD (gastroesophageal reflux disease)   . Headache    hx of   . Hyperlipemia   . Peripheral vascular disease ([place])   . Polio    as a child--loss muscle of lower left leg  . Polio   . PONV (postoperative nausea and vomiting)     Past Surgical History:  Procedure Laterality Date  . BREAST CYST EXCISION Left   . BREAST SURGERY     reduction  . cysts removed from bilateral ankles     . ear pinned back     . EYE SURGERY     "fat removed from under R eye"  . OOPHORECTOMY Bilateral    was removed b/c mother hx of ovarian cancer  . REDUCTION MAMMAPLASTY    . REPLACEMENT TOTAL KNEE  2006   right knee  . TONSILLECTOMY    . TOTAL HIP ARTHROPLASTY Right 09/27/2016   Procedure: RIGHT TOTAL HIP ARTHROPLASTY ANTERIOR APPROACH;  Surgeon: Mcarthur Rossetti, MD;  Location: [facility];  Service: Orthopedics;  Laterality: Right;  . tummy tuck  1997    There were no vitals filed for this visit.      Subjective Assessment - 01/20/17 1016    Patient Stated Goals get rid of anterior hip pain    Currently in Pain? Yes   Pain Score 4    Pain Orientation Right;Anterior   Pain  Descriptors / Indicators Aching   Pain Type Surgical pain   Pain Onset More than a month ago   Pain Frequency Intermittent   Aggravating Factors  prolonged sitting   Pain Relieving Factors movement, heat.                         [place] [facility] Adult PT Treatment/Exercise - 01/20/17 0001      Knee/Hip Exercises: Aerobic   Nustep L4x5'     Knee/Hip Exercises: Supine   Single Leg Bridge Strengthening;Both;2 sets;10 reps  figure 4     Knee/Hip Exercises: Sidelying   Other Sidelying Knee/Hip Exercises 10 reps pilates FWD/BWD kicks, CW/CCW circles     Modalities   Modalities Electrical Stimulation;Moist Heat     Moist Heat Therapy   Number Minutes Moist Heat 20 Minutes   Moist Heat Location Hip     Electrical Stimulation   Electrical Stimulation Location Rt anterior hip to lateral hip to hip adductors    Electrical Stimulation Action IFC   Electrical Stimulation Parameters to tolerance   Electrical Stimulation Goals Pain;Tone     Manual  Therapy   Manual Therapy Soft tissue mobilization   Soft tissue mobilization STM to Rt TFL, quad and iliacus.  Very tight in the TFL          Trigger Point Dry Needling - 01/20/17 1027    Consent Given? Yes   Education Handout Provided Yes   Muscles Treated Lower Body Quadriceps;Tensor fascia lata  Rt iliacus - good twitch response   Tensor Fascia Lata Response Twitch response elicited;Palpable increased muscle length  Rt   Quadriceps Response Palpable increased muscle length;Twitch response elicited  Rt                   PT Long Term Goals - 01/20/17 1058      PT LONG TERM GOAL #1   Title Improve tissue extensibility Rt hip allowing patient to fuly extend hip in standing and for functional activities 02/26/17   Status On-going     PT LONG TERM GOAL #2   Title Decrease pain by 75% with functional activities including sit to stand and walking 02/26/17   Status On-going     PT LONG TERM GOAL #3   Title Improve  gait pattern with least restrictive device to no device for gait 02/26/17   Status On-going     PT LONG TERM GOAL #4   Title Independent HEP 02/26/17   Status On-going     PT LONG TERM GOAL #5   Title Improve FOTO to </= 42% limitation 02/26/17   Status On-going               Plan - 01/20/17 1059    Clinical Impression Statement This is Joanne Edwards's second visit, no goals met.  She had good releases with manual work and DN.  Increased tissue elasticity and reported ease with transitioning to stand.     Rehab Potential Good   PT Frequency 2x / week   PT Duration 6 weeks   PT Treatment/Interventions Patient/family education;ADLs/Self Care Home Management;Cryotherapy;Electrical Stimulation;Iontophoresis 27m/ml Dexamethasone;Moist Heat;Ultrasound;Dry needling;Manual techniques;Therapeutic activities;Therapeutic exercise;Neuromuscular re-education   PT Next Visit Plan assess response to DN and manual work, continue PRN   Consulted and Agree with Plan of Care Patient      Patient will benefit from skilled therapeutic intervention in order to improve the following deficits and impairments:  Postural dysfunction, Improper body mechanics, Pain, Increased fascial restricitons, Increased muscle spasms, Decreased strength, Abnormal gait, Decreased activity tolerance  Visit Diagnosis: Other abnormalities of gait and mobility  Other symptoms and signs involving the musculoskeletal system  Pain in right hip     Problem List Patient Active Problem List   Diagnosis Date Noted  . DJD (degenerative joint disease) 12/11/2016  . Status post total replacement of right hip 09/27/2016  . Unilateral primary osteoarthritis, right hip 07/24/2016  . Trochanteric bursitis, right hip 07/24/2016  . Urinary incontinence 02/06/2016  . PCP NOTES >>>>>> 02/22/2015  . Thyroid cyst 02/21/2015  . Pain and swelling of left lower leg 05/17/2014  . Annual physical exam 01/06/2014  . Left leg weakness  09/28/2013  . GERD (gastroesophageal reflux disease) 07/02/2013  . Hyperlipidemia 07/02/2013  . Depression with anxiety 07/02/2013  . Varicose veins of lower extremities with other complications 165/78/4696 . Special screening for malignant neoplasms, colon 10/14/2012    SJeral PinchPT  01/20/2017, 11:01 AM  [facility] [address]Phone: [phone]  Fax:  [phone] Name: JJACKI COUSEMRN: [id number]  Date of Birth: Jun 02, 1940

## 2017-01-20 NOTE — Patient Instructions (Signed)

## 2017-01-22 ENCOUNTER — Ambulatory Visit (INDEPENDENT_AMBULATORY_CARE_PROVIDER_SITE_OTHER): Payer: Medicare HMO | Admitting: Physical Therapy

## 2017-01-22 DIAGNOSIS — R2689 Other abnormalities of gait and mobility: Secondary | ICD-10-CM

## 2017-01-22 DIAGNOSIS — R29898 Other symptoms and signs involving the musculoskeletal system: Secondary | ICD-10-CM | POA: Diagnosis not present

## 2017-01-22 DIAGNOSIS — M25551 Pain in right hip: Secondary | ICD-10-CM

## 2017-01-22 NOTE — Therapy (Signed)
[place], [place], [address] Phone: [phone]   Fax:  [phone]  Physical Therapy Treatment  Patient Details  Name: Joanne Edwards MRN: [id number] Date of Birth: May 25, 1940 Referring Provider: Dr Jean Rosenthal   Encounter Date: 01/22/2017      PT End of Session - 01/22/17 1405    Visit Number 3   Number of Visits 12   Date for PT Re-Evaluation 02/26/17   PT Start Time 7867   PT Stop Time 1517   PT Time Calculation (min) 72 min   Activity Tolerance Patient tolerated treatment well      Past Medical History:  Diagnosis Date  . Anxiety   . Arthritis   . Balance problem    going down steps  . Complication of anesthesia    patient does not want any spinal anesthesia  . Depression    lost a son 2011 (aneurysm)  . GERD (gastroesophageal reflux disease)   . Headache    hx of   . Hyperlipemia   . Peripheral vascular disease ([place])   . Polio    as a child--loss muscle of lower left leg  . Polio   . PONV (postoperative nausea and vomiting)     Past Surgical History:  Procedure Laterality Date  . BREAST CYST EXCISION Left   . BREAST SURGERY     reduction  . cysts removed from bilateral ankles     . ear pinned back     . EYE SURGERY     "fat removed from under R eye"  . OOPHORECTOMY Bilateral    was removed b/c mother hx of ovarian cancer  . REDUCTION MAMMAPLASTY    . REPLACEMENT TOTAL KNEE  2006   right knee  . TONSILLECTOMY    . TOTAL HIP ARTHROPLASTY Right 09/27/2016   Procedure: RIGHT TOTAL HIP ARTHROPLASTY ANTERIOR APPROACH;  Surgeon: Mcarthur Rossetti, MD;  Location: [facility];  Service: Orthopedics;  Laterality: Right;  . tummy tuck  1997    There were no vitals filed for this visit.      Subjective Assessment - 01/22/17 1406    Subjective Pt reports she felt good after the last treatment however sore today because she has been pulling up carpet all morning.   Feels like the DN helped a lot and she just aggrivated it with the carpet.    Pertinent History Hx of Rt TKA 2005; artihritis; polio as a child(age 23) Lt LE residual weakness; leg length difference wears 1/2 inch lift Lt shoe    Patient Stated Goals get rid of anterior hip pain    Currently in Pain? Yes   Pain Score 5    Pain Location Hip   Pain Orientation Right;Anterior  into abdomen   Pain Descriptors / Indicators Aching                         [facility] Adult PT Treatment/Exercise - 01/22/17 0001      Self-Care   Self-Care Other Self-Care Comments   Other Self-Care Comments  discussed that her husband was in earlier and shared that she has a problem with headaches.  She states that it is from stress, explained self trigger point release using tennis balls.      Knee/Hip Exercises: Aerobic   Nustep L4x5'     Knee/Hip Exercises: Standing   Forward Lunges 20 reps  mini lunges, Rt LE back to stretch  SLS Rt 2x10 Lt toe taps FWD/side/BWD     Knee/Hip Exercises: Supine   Straight Leg Raises Strengthening;Both;10 reps  with TA bracing   Other Supine Knee/Hip Exercises 10 reps leg lengthener      Modalities   Modalities Electrical Stimulation;Moist Heat     Moist Heat Therapy   Number Minutes Moist Heat 20 Minutes   Moist Heat Location --  Rt inner thigh and cervical     Electrical Stimulation   Electrical Stimulation Location Rt inner thigh   Electrical Stimulation Action IFC   Electrical Stimulation Parameters to tolerance   Electrical Stimulation Goals Pain;Tone     Manual Therapy   Manual Therapy Soft tissue mobilization   Soft tissue mobilization STM to Rt quad, TFL, adductor muscles.           Trigger Point Dry Needling - 01/22/17 1433    Consent Given? Yes   Education Handout Provided No   Muscles Treated Lower Body Adductor longus/brevius/maximus   Adductor Response Palpable increased muscle length;Twitch response elicited  Rt with stim                    PT Long Term Goals - 01/20/17 1058      PT LONG TERM GOAL #1   Title Improve tissue extensibility Rt hip allowing patient to fuly extend hip in standing and for functional activities 02/26/17   Status On-going     PT LONG TERM GOAL #2   Title Decrease pain by 75% with functional activities including sit to stand and walking 02/26/17   Status On-going     PT LONG TERM GOAL #3   Title Improve gait pattern with least restrictive device to no device for gait 02/26/17   Status On-going     PT LONG TERM GOAL #4   Title Independent HEP 02/26/17   Status On-going     PT LONG TERM GOAL #5   Title Improve FOTO to </= 42% limitation 02/26/17   Status On-going               Plan - 01/22/17 1459    Clinical Impression Statement Devanee had a great response to her DN treatment earlier in the week.  She over did it today and had some soreness when she came to the clinic, this decreased with exercise.     Rehab Potential Good   PT Frequency 2x / week   PT Duration 6 weeks   PT Treatment/Interventions Patient/family education;ADLs/Self Care Home Management;Cryotherapy;Electrical Stimulation;Iontophoresis 4mg /ml Dexamethasone;Moist Heat;Ultrasound;Dry needling;Manual techniques;Therapeutic activities;Therapeutic exercise;Neuromuscular re-education   PT Next Visit Plan assess response to DN and manual work, continue PRN   Consulted and Agree with Plan of Care Patient      Patient will benefit from skilled therapeutic intervention in order to improve the following deficits and impairments:  Postural dysfunction, Improper body mechanics, Pain, Increased fascial restricitons, Increased muscle spasms, Decreased strength, Abnormal gait, Decreased activity tolerance  Visit Diagnosis: Pain in right hip  Other symptoms and signs involving the musculoskeletal system  Other abnormalities of gait and mobility     Problem List Patient Active Problem List   Diagnosis  Date Noted  . DJD (degenerative joint disease) 12/11/2016  . Status post total replacement of right hip 09/27/2016  . Unilateral primary osteoarthritis, right hip 07/24/2016  . Trochanteric bursitis, right hip 07/24/2016  . Urinary incontinence 02/06/2016  . PCP NOTES >>>>>> 02/22/2015  . Thyroid cyst 02/21/2015  . Pain and swelling of left  lower leg 05/17/2014  . Annual physical exam 01/06/2014  . Left leg weakness 09/28/2013  . GERD (gastroesophageal reflux disease) 07/02/2013  . Hyperlipidemia 07/02/2013  . Depression with anxiety 07/02/2013  . Varicose veins of lower extremities with other complications 38/46/6599  . Special screening for malignant neoplasms, colon 10/14/2012    Manuela Schwartz Angelik Walls PT  01/22/2017, 3:02 PM  [facility] [place], [place], [address] Phone: [phone]   Fax:  [phone]  Name: MARKIAH JANEWAY MRN: [id number] Date of Birth: 12-Nov-1940

## 2017-01-23 ENCOUNTER — Encounter: Payer: Medicare HMO | Admitting: Rehabilitative and Restorative Service Providers"

## 2017-01-29 ENCOUNTER — Encounter: Payer: Medicare HMO | Admitting: Rehabilitative and Restorative Service Providers"

## 2017-01-31 ENCOUNTER — Encounter: Payer: Self-pay | Admitting: Physical Therapy

## 2017-01-31 ENCOUNTER — Ambulatory Visit (INDEPENDENT_AMBULATORY_CARE_PROVIDER_SITE_OTHER): Payer: Medicare HMO | Admitting: Physical Therapy

## 2017-01-31 DIAGNOSIS — M25551 Pain in right hip: Secondary | ICD-10-CM | POA: Diagnosis not present

## 2017-01-31 DIAGNOSIS — R2689 Other abnormalities of gait and mobility: Secondary | ICD-10-CM | POA: Diagnosis not present

## 2017-01-31 DIAGNOSIS — R29898 Other symptoms and signs involving the musculoskeletal system: Secondary | ICD-10-CM

## 2017-01-31 NOTE — Therapy (Signed)
[place], [place], [address] Phone: [phone]   Fax:  [phone]  Physical Therapy Treatment  Patient Details  Name: Joanne Edwards MRN: [id number] Date of Birth: Apr 08, 1941 Referring Provider: Dr Jean Rosenthal   Encounter Date: 01/31/2017      PT End of Session - 01/31/17 1148    Visit Number 4   Number of Visits 12   Date for PT Re-Evaluation 02/26/17   PT Start Time 0962   PT Stop Time 1258   PT Time Calculation (min) 70 min   Activity Tolerance Patient tolerated treatment well      Past Medical History:  Diagnosis Date  . Anxiety   . Arthritis   . Balance problem    going down steps  . Complication of anesthesia    patient does not want any spinal anesthesia  . Depression    lost a son 2011 (aneurysm)  . GERD (gastroesophageal reflux disease)   . Headache    hx of   . Hyperlipemia   . Peripheral vascular disease ([place])   . Polio    as a child--loss muscle of lower left leg  . Polio   . PONV (postoperative nausea and vomiting)     Past Surgical History:  Procedure Laterality Date  . BREAST CYST EXCISION Left   . BREAST SURGERY     reduction  . cysts removed from bilateral ankles     . ear pinned back     . EYE SURGERY     "fat removed from under R eye"  . OOPHORECTOMY Bilateral    was removed b/c mother hx of ovarian cancer  . REDUCTION MAMMAPLASTY    . REPLACEMENT TOTAL KNEE  2006   right knee  . TONSILLECTOMY    . TOTAL HIP ARTHROPLASTY Right 09/27/2016   Procedure: RIGHT TOTAL HIP ARTHROPLASTY ANTERIOR APPROACH;  Surgeon: Mcarthur Rossetti, MD;  Location: [facility];  Service: Orthopedics;  Laterality: Right;  . tummy tuck  1997    There were no vitals filed for this visit.      Subjective Assessment - 01/31/17 1155    Subjective Pt reports her hip feels better, not having the stabbing sensation anymore.  Now feels stiffness if she has been still for a  long period of time. Has increased pain with sitting in her car. She is questioning if her shoe lift is off.    Patient Stated Goals get rid of anterior hip pain    Currently in Pain? No/denies            [facility] PT Assessment - 01/31/17 0001      Posture/Postural Control   Posture Comments leg is shorter than Rt, in even with the lift. Instanding with her lifts the Lt iliac crest was lower.                       [facility] Adult PT Treatment/Exercise - 01/31/17 0001      Knee/Hip Exercises: Aerobic   Nustep L4x5'     Knee/Hip Exercises: Standing   SLS Rt with Lt toe taps in all  directions & with FWD leans     Modalities   Modalities Electrical Stimulation;Moist Heat     Moist Heat Therapy   Number Minutes Moist Heat 15 Minutes   Moist Heat Location --  buttocks     Electrical Stimulation   Electrical Stimulation Location Rt buttocks   Electrical  Stimulation Action IFC    Electrical Stimulation Parameters to tolerance   Electrical Stimulation Goals Pain;Tone     Manual Therapy   Manual Therapy Joint mobilization;Passive ROM   Joint Mobilization PA mobs Rt hip grade III   Soft tissue mobilization Rt gluts upper STM to loosen up trigger points   Passive ROM prone IR/ER stretches     Issued an heel lift for LT shoe with all three layers.  Instructed in use.        Trigger Point Dry Needling - 01/31/17 1241    Consent Given? Yes   Education Handout Provided No   Muscles Treated Lower Body Gluteus minimus;Gluteus maximus   Gluteus Maximus Response Palpable increased muscle length;Twitch response elicited  Rt   Gluteus Minimus Response Palpable increased muscle length;Twitch response elicited  Rt                   PT Long Term Goals - 01/20/17 1058      PT LONG TERM GOAL #1   Title Improve tissue extensibility Rt hip allowing patient to fuly extend hip in standing and for functional activities 02/26/17   Status On-going     PT LONG TERM  GOAL #2   Title Decrease pain by 75% with functional activities including sit to stand and walking 02/26/17   Status On-going     PT LONG TERM GOAL #3   Title Improve gait pattern with least restrictive device to no device for gait 02/26/17   Status On-going     PT LONG TERM GOAL #4   Title Independent HEP 02/26/17   Status On-going     PT LONG TERM GOAL #5   Title Improve FOTO to </= 42% limitation 02/26/17   Status On-going               Plan - 01/31/17 1246    Clinical Impression Statement Joanne Edwards is responding very well to treatment, her hip is loosening up and her pain is decreasing.  She is concerned as she feels the Lt leg is shorter then Rt even with her lift.  Upon assessment in standing the iliac crest was off.  She was issued one of our inserts and was in better alignment with it, She is going to use that and see how it feels.    Rehab Potential Good   PT Frequency 2x / week   PT Duration 6 weeks   PT Treatment/Interventions Patient/family education;ADLs/Self Care Home Management;Cryotherapy;Electrical Stimulation;Iontophoresis 4mg /ml Dexamethasone;Moist Heat;Ultrasound;Dry needling;Manual techniques;Therapeutic activities;Therapeutic exercise;Neuromuscular re-education   PT Next Visit Plan assess response to DN and manual stretches, along with new heel lift.    Consulted and Agree with Plan of Care Patient      Patient will benefit from skilled therapeutic intervention in order to improve the following deficits and impairments:  Postural dysfunction, Improper body mechanics, Pain, Increased fascial restricitons, Increased muscle spasms, Decreased strength, Abnormal gait, Decreased activity tolerance  Visit Diagnosis: Pain in right hip  Other symptoms and signs involving the musculoskeletal system  Other abnormalities of gait and mobility     Problem List Patient Active Problem List   Diagnosis Date Noted  . DJD (degenerative joint disease) 12/11/2016  .  Status post total replacement of right hip 09/27/2016  . Unilateral primary osteoarthritis, right hip 07/24/2016  . Trochanteric bursitis, right hip 07/24/2016  . Urinary incontinence 02/06/2016  . PCP NOTES >>>>>> 02/22/2015  . Thyroid cyst 02/21/2015  . Pain and swelling of left lower  leg 05/17/2014  . Annual physical exam 01/06/2014  . Left leg weakness 09/28/2013  . GERD (gastroesophageal reflux disease) 07/02/2013  . Hyperlipidemia 07/02/2013  . Depression with anxiety 07/02/2013  . Varicose veins of lower extremities with other complications 56/38/9373  . Special screening for malignant neoplasms, colon 10/14/2012    Jeral Pinch PT  01/31/2017, 12:50 PM  [facility] [place], [place], [address] Phone: [phone]   Fax:  [phone]  Name: Joanne Edwards MRN: [id number] Date of Birth: 09-14-1940

## 2017-02-04 ENCOUNTER — Encounter: Payer: Medicare HMO | Admitting: Rehabilitative and Restorative Service Providers"

## 2017-02-13 ENCOUNTER — Ambulatory Visit (INDEPENDENT_AMBULATORY_CARE_PROVIDER_SITE_OTHER): Payer: Medicare HMO | Admitting: Rehabilitative and Restorative Service Providers"

## 2017-02-13 ENCOUNTER — Encounter: Payer: Self-pay | Admitting: Rehabilitative and Restorative Service Providers"

## 2017-02-13 DIAGNOSIS — R2689 Other abnormalities of gait and mobility: Secondary | ICD-10-CM

## 2017-02-13 DIAGNOSIS — M25551 Pain in right hip: Secondary | ICD-10-CM

## 2017-02-13 DIAGNOSIS — R29898 Other symptoms and signs involving the musculoskeletal system: Secondary | ICD-10-CM | POA: Diagnosis not present

## 2017-02-13 NOTE — Therapy (Signed)
[facility]  [place], [place], [address] Phone: [phone]   Fax:  [phone]  Physical Therapy Treatment  Patient Details  Name: Joanne Edwards MRN: [id number] Date of Birth: 1940-10-22 Referring Provider: Dr Jean Rosenthal    Encounter Date: 02/13/2017  PT End of Session - 02/13/17 1724    Visit Number  5    Number of Visits  12    Date for PT Re-Evaluation  02/26/17    PT Start Time  3536    PT Stop Time  1443    PT Time Calculation (min)  57 min    Activity Tolerance  Patient tolerated treatment well       Past Medical History:  Diagnosis Date  . Anxiety   . Arthritis   . Balance problem    going down steps  . Complication of anesthesia    patient does not want any spinal anesthesia  . Depression    lost a son 2011 (aneurysm)  . GERD (gastroesophageal reflux disease)   . Headache    hx of   . Hyperlipemia   . Peripheral vascular disease ([place])   . Polio    as a child--loss muscle of lower left leg  . Polio   . PONV (postoperative nausea and vomiting)     Past Surgical History:  Procedure Laterality Date  . BREAST CYST EXCISION Left   . BREAST SURGERY     reduction  . cysts removed from bilateral ankles     . ear pinned back     . EYE SURGERY     "fat removed from under R eye"  . OOPHORECTOMY Bilateral    was removed b/c mother hx of ovarian cancer  . REDUCTION MAMMAPLASTY    . REPLACEMENT TOTAL KNEE  2006   right knee  . TONSILLECTOMY    . tummy tuck  1997    There were no vitals filed for this visit.  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 She has some continued hip pain but notes good improvement in symtpoms with DN. She still has pain when moving sit to stand and after sitting in the car.     Currently in Pain?  Yes    Pain Score  5     Pain  Location  Hip    Pain Orientation  Right;Anterior;Lateral    Pain Descriptors / Indicators  Aching    Pain Type  Surgical pain    Pain Onset  More than a month ago    Pain Frequency  Intermittent                      [facility] Adult PT Treatment/Exercise - 02/13/17 0001      Knee/Hip Exercises: Stretches   Other Knee/Hip Stretches  Pt assist for hip adductor stretch 30 sec x 3 Rt LE in supine       Moist Heat Therapy   Number Minutes Moist Heat  20 Minutes    Moist Heat Location  Hip;Lumbar Spine anterior lateral       Electrical Stimulation   Electrical Stimulation Location  Rr hip adductors; hip flexors     Electrical Stimulation Action  IFC    Electrical Stimulation Parameters  to tolerance    Electrical Stimulation Goals  Pain;Tone  Manual Therapy   Manual therapy comments  pt supine     Joint Mobilization  AP Rt hip mobs with pat in supine    Soft tissue mobilization  Rt hip adductors/hip flexors/lateral hip musculature     Passive ROM  hip rotation and abduction        Trigger Point Dry Needling - 02/13/17 1723    Consent Given?  Yes    Tensor Fascia Lata Response  Palpable increased muscle length    Quadriceps Response  Palpable increased muscle length    Adductor Response  Palpable increased muscle length                PT Long Term Goals - 02/13/17 1738      PT LONG TERM GOAL #1   Title  Improve tissue extensibility Rt hip allowing patient to fuly extend hip in standing and for functional activities 02/26/17    Time  6    Period  Weeks    Status  On-going      PT LONG TERM GOAL #2   Title  Decrease pain by 75% with functional activities including sit to stand and walking 02/26/17    Time  6    Period  Weeks    Status  On-going      PT LONG TERM GOAL #3   Title  Improve gait pattern with least restrictive device to no device for gait 02/26/17    Time  6    Period  Weeks    Status  On-going      PT LONG TERM GOAL #4   Title   Independent HEP 02/26/17    Time  6    Period  Weeks    Status  On-going      PT LONG TERM GOAL #5   Title  Improve FOTO to </= 42% limitation 02/26/17    Time  6    Period  Weeks    Status  On-going            Plan - 02/13/17 1725    Clinical Impression Statement  Joanne Edwards continues to have muscular tightness through the Rt hip anterior/lateral/posterior musculature. She responds well to DN and manual work with good decrease in muscular tightness.     Rehab Potential  Good    PT Frequency  2x / week    PT Duration  6 weeks    PT Treatment/Interventions  Patient/family education;ADLs/Self Care Home Management;Cryotherapy;Electrical Stimulation;Iontophoresis 4mg /ml Dexamethasone;Moist Heat;Ultrasound;Dry needling;Manual techniques;Therapeutic activities;Therapeutic exercise;Neuromuscular re-education    PT Next Visit Plan  assess response to DN and manual stretches, along with new heel lift.     Consulted and Agree with Plan of Care  Patient       Patient will benefit from skilled therapeutic intervention in order to improve the following deficits and impairments:  Postural dysfunction, Improper body mechanics, Pain, Increased fascial restricitons, Increased muscle spasms, Decreased strength, Abnormal gait, Decreased activity tolerance  Visit Diagnosis: Pain in right hip  Other symptoms and signs involving the musculoskeletal system  Other abnormalities of gait and mobility     Problem List Patient Active Problem List   Diagnosis Date Noted  . DJD (degenerative joint disease) 12/11/2016  . Status post total replacement of right hip 09/27/2016  . Unilateral primary osteoarthritis, right hip 07/24/2016  . Trochanteric bursitis, right hip 07/24/2016  . Urinary incontinence 02/06/2016  . PCP NOTES >>>>>> 02/22/2015  . Thyroid cyst 02/21/2015  . Pain and swelling of  left lower leg 05/17/2014  . Annual physical exam 01/06/2014  . Left leg weakness 09/28/2013  . GERD  (gastroesophageal reflux disease) 07/02/2013  . Hyperlipidemia 07/02/2013  . Depression with anxiety 07/02/2013  . Varicose veins of lower extremities with other complications 21/82/8833  . Special screening for malignant neoplasms, colon 10/14/2012    Joanne Edwards Joanne Edwards PT; MPH  02/13/2017, 5:40 PM  [facility] [place], [place], [address] Phone: [phone]   Fax:  [phone]  Name: TERIANNE THAKER MRN: [id number] Date of Birth: 16-Jun-1940

## 2017-02-14 ENCOUNTER — Other Ambulatory Visit: Payer: Self-pay | Admitting: Internal Medicine

## 2017-02-18 ENCOUNTER — Encounter: Payer: Medicare HMO | Admitting: Physical Therapy

## 2017-02-20 ENCOUNTER — Encounter: Payer: Medicare HMO | Admitting: Rehabilitative and Restorative Service Providers"

## 2017-02-21 ENCOUNTER — Ambulatory Visit (INDEPENDENT_AMBULATORY_CARE_PROVIDER_SITE_OTHER): Payer: Medicare HMO | Admitting: Physical Therapy

## 2017-02-21 DIAGNOSIS — M25551 Pain in right hip: Secondary | ICD-10-CM

## 2017-02-21 DIAGNOSIS — R2689 Other abnormalities of gait and mobility: Secondary | ICD-10-CM | POA: Diagnosis not present

## 2017-02-21 DIAGNOSIS — R29898 Other symptoms and signs involving the musculoskeletal system: Secondary | ICD-10-CM

## 2017-02-21 NOTE — Therapy (Addendum)
[place], [place], [address] Phone: [phone]   Fax:  [phone]  Physical Therapy Treatment  Patient Details  Name: ESTEFANY GOEBEL MRN: [id number] Date of Birth: Nov 26, 1940 Referring Provider: Dr. Ninfa Linden   Encounter Date: 02/21/2017  PT End of Session - 02/21/17 1454    Visit Number  6    Number of Visits  12    Date for PT Re-Evaluation  02/26/17    PT Start Time  1450    PT Stop Time  1550    PT Time Calculation (min)  60 min    Activity Tolerance  Patient tolerated treatment well    Behavior During Therapy  [facility] for tasks assessed/performed       Past Medical History:  Diagnosis Date  . Anxiety   . Arthritis   . Balance problem    going down steps  . Complication of anesthesia    patient does not want any spinal anesthesia  . Depression    lost a son 2011 (aneurysm)  . GERD (gastroesophageal reflux disease)   . Headache    hx of   . Hyperlipemia   . Peripheral vascular disease ([place])   . Polio    as a child--loss muscle of lower left leg  . Polio   . PONV (postoperative nausea and vomiting)     Past Surgical History:  Procedure Laterality Date  . BREAST CYST EXCISION Left   . BREAST SURGERY     reduction  . cysts removed from bilateral ankles     . ear pinned back     . EYE SURGERY     "fat removed from under R eye"  . OOPHORECTOMY Bilateral    was removed b/c mother hx of ovarian cancer  . REDUCTION MAMMAPLASTY    . REPLACEMENT TOTAL KNEE  2006   right knee  . RIGHT TOTAL HIP ARTHROPLASTY ANTERIOR APPROACH Right 09/27/2016   Performed by Mcarthur Rossetti, MD at [facility]  . TONSILLECTOMY    . tummy tuck  1997    There were no vitals filed for this visit.  Subjective Assessment - 02/21/17 1452    Subjective  "I just want to focus on loosening the fascia of this hip. I have plenty of exercise I can do when I'm at home." She reports the DN has helped a great  deal.      Currently in Pain?  No/denies    Pain Score  0-No pain up to 3/10 with transitioning moves.     Pain Orientation  Right;Anterior;Lateral    Aggravating Factors   prolonged sitting     Pain Relieving Factors  movement, heat         [facility] PT Assessment - 02/21/17 0001      Assessment   Medical Diagnosis  s/p Rt THA    Referring Provider  Dr. Ninfa Linden    Onset Date/Surgical Date  09/27/16    Hand Dominance  Right    Next MD Visit  PRN       Palpation   Palpation comment  muscular tightness noted in Rt hip adductors; lateral quad; TFL; ITB        [facility] Adult PT Treatment/Exercise - 02/21/17 0001      Moist Heat Therapy   Number Minutes Moist Heat  20 Minutes    Moist Heat Location  Hip;Lumbar Spine anterior lateral       Electrical Stimulation   Electrical  Stimulation Location  Rt hip adductors; Rt TFL     Electrical Stimulation Action  IFC    Electrical Stimulation Parameters  to tolerance     Electrical Stimulation Goals  Pain;Tone      Manual Therapy   Manual Therapy  Taping;Myofascial release    Manual therapy comments  Pt supine and Lt sidelying.  Blue colored reg Rock tape applied with 15% stretch over Rt lateral hip (where bruise was) in X pattern to decompress tissue, decrease pain, and improve proprioception.      Soft tissue mobilization  Rt hip adductors/hip flexors/lateral hip musculature     Myofascial Release  MFR to Rt TFL, ITB, lateral quad, distal adductor.     Passive ROM  hip rotation and abduction              PT Education - 02/21/17 1549    Education provided  Yes    Education Details  info on TENS (shown model), kinesiotape rationale and safe removal instructions    Person(s) Educated  Patient    Methods  Explanation    Comprehension  Verbalized understanding          PT Long Term Goals - 02/21/17 1545      PT LONG TERM GOAL #1   Title  Improve tissue extensibility Rt hip allowing patient to fully extend hip in standing and  for functional activities 02/26/17    Time  6    Period  Weeks    Status  On-going improving      PT LONG TERM GOAL #2   Title  Decrease pain by 75% with functional activities including sit to stand and walking 02/26/17    Time  6    Period  Weeks    Status  Partially Met      PT LONG TERM GOAL #3   Title  Improve gait pattern with least restrictive device to no device for gait 02/26/17    Time  6    Period  Weeks    Status  Partially Met ambulates with antalgic gait without AD      PT LONG TERM GOAL #4   Title  Independent HEP 02/26/17    Time  6    Period  Weeks    Status  On-going      PT LONG TERM GOAL #5   Title  Improve FOTO to </= 42% limitation 02/26/17    Time  6    Period  Weeks    Status  On-going            Plan - 02/21/17 1547    Clinical Impression Statement  Pt has had positive response to manual therapy and DN last few sessions; muscular tightness has decreased some. She is able to transition to standing after sitting with less pain and is ambulating without AD.  She has partially met LGT 2 and 3; is making good gains towards remaining goals.     Rehab Potential  Good    PT Frequency  2x / week    PT Duration  6 weeks    PT Treatment/Interventions  Patient/family education;ADLs/Self Care Home Management;Cryotherapy;Electrical Stimulation;Iontophoresis 72m/ml Dexamethasone;Moist Heat;Ultrasound;Dry needling;Manual techniques;Therapeutic activities;Therapeutic exercise;Neuromuscular re-education    PT Next Visit Plan  FOTO - end of POC. Assess goals and need for additional therapy vs d/c.     Consulted and Agree with Plan of Care  Patient       Patient will benefit from skilled therapeutic intervention  in order to improve the following deficits and impairments:  Postural dysfunction, Improper body mechanics, Pain, Increased fascial restricitons, Increased muscle spasms, Decreased strength, Abnormal gait, Decreased activity tolerance  Visit  Diagnosis: Pain in right hip  Other symptoms and signs involving the musculoskeletal system  Other abnormalities of gait and mobility     Problem List Patient Active Problem List   Diagnosis Date Noted  . DJD (degenerative joint disease) 12/11/2016  . Status post total replacement of right hip 09/27/2016  . Unilateral primary osteoarthritis, right hip 07/24/2016  . Trochanteric bursitis, right hip 07/24/2016  . Urinary incontinence 02/06/2016  . PCP NOTES >>>>>> 02/22/2015  . Thyroid cyst 02/21/2015  . Pain and swelling of left lower leg 05/17/2014  . Annual physical exam 01/06/2014  . Left leg weakness 09/28/2013  . GERD (gastroesophageal reflux disease) 07/02/2013  . Hyperlipidemia 07/02/2013  . Depression with anxiety 07/02/2013  . Varicose veins of lower extremities with other complications 34/91/7915  . Special screening for malignant neoplasms, colon 10/14/2012   Kerin Perna, PTA 02/21/17 4:07 PM  [place], [place], [address] Phone: [phone]   Fax:  [phone]  Name: JAIELLE DLOUHY MRN: [id number] Date of Birth: 1940-07-03   PHYSICAL THERAPY DISCHARGE SUMMARY  Visits from Start of Care: 6  Current functional level related to goals / functional outcomes: unknown   Remaining deficits: Unknown, was responding well to manual work and trigger point dry needling   Education / Equipment: HEP Plan:                                                    Patient goals were partially met. Patient is being discharged due to not returning since the last visit.  ?????     Jeral Pinch, PT 04/10/17 4:15 PM

## 2017-03-27 ENCOUNTER — Encounter: Payer: Self-pay | Admitting: Internal Medicine

## 2017-03-27 MED ORDER — OMEPRAZOLE 20 MG PO CPDR: 20.0000 mg | DELAYED_RELEASE_CAPSULE | Freq: Two times a day (BID) | ORAL | 3 refills | Status: DC

## 2017-04-23 ENCOUNTER — Encounter: Payer: Self-pay | Admitting: Internal Medicine

## 2017-04-23 ENCOUNTER — Ambulatory Visit (INDEPENDENT_AMBULATORY_CARE_PROVIDER_SITE_OTHER): Payer: Medicare HMO | Admitting: Internal Medicine

## 2017-04-23 VITALS — BP 126/80 | HR 68 | Temp 97.6°F | Resp 14 | Ht 64.0 in | Wt 186.5 lb

## 2017-04-23 DIAGNOSIS — R739 Hyperglycemia, unspecified: Secondary | ICD-10-CM | POA: Diagnosis not present

## 2017-04-23 DIAGNOSIS — E785 Hyperlipidemia, unspecified: Secondary | ICD-10-CM | POA: Diagnosis not present

## 2017-04-23 DIAGNOSIS — Z0000 Encounter for general adult medical examination without abnormal findings: Secondary | ICD-10-CM

## 2017-04-23 LAB — CBC WITH DIFFERENTIAL/PLATELET
BASOS ABS: 0 10*3/uL (ref 0.0–0.1)
Basophils Relative: 0.5 % (ref 0.0–3.0)
Eosinophils Absolute: 0.1 10*3/uL (ref 0.0–0.7)
Eosinophils Relative: 2 % (ref 0.0–5.0)
HCT: 37.8 % (ref 36.0–46.0)
Hemoglobin: 12.6 g/dL (ref 12.0–15.0)
LYMPHS ABS: 1.5 10*3/uL (ref 0.7–4.0)
Lymphocytes Relative: 33 % (ref 12.0–46.0)
MCHC: 33.2 g/dL (ref 30.0–36.0)
MCV: 92.5 fl (ref 78.0–100.0)
MONO ABS: 0.4 10*3/uL (ref 0.1–1.0)
MONOS PCT: 8 % (ref 3.0–12.0)
NEUTROS PCT: 56.5 % (ref 43.0–77.0)
Neutro Abs: 2.6 10*3/uL (ref 1.4–7.7)
Platelets: 202 10*3/uL (ref 150.0–400.0)
RBC: 4.09 Mil/uL (ref 3.87–5.11)
RDW: 13.3 % (ref 11.5–15.5)
WBC: 4.6 10*3/uL (ref 4.0–10.5)

## 2017-04-23 LAB — COMPREHENSIVE METABOLIC PANEL
ALT: 14 U/L (ref 0–35)
AST: 17 U/L (ref 0–37)
Albumin: 4.4 g/dL (ref 3.5–5.2)
Alkaline Phosphatase: 64 U/L (ref 39–117)
BILIRUBIN TOTAL: 0.4 mg/dL (ref 0.2–1.2)
BUN: 19 mg/dL (ref 6–23)
CO2: 33 meq/L — AB (ref 19–32)
Calcium: 9.8 mg/dL (ref 8.4–10.5)
Chloride: 103 mEq/L (ref 96–112)
Creatinine, Ser: 0.72 mg/dL (ref 0.40–1.20)
GFR: 84.08 mL/min (ref >60.00)
GLUCOSE: 105 mg/dL — AB (ref 70–99)
Potassium: 4.3 mEq/L (ref 3.5–5.1)
SODIUM: 141 meq/L (ref 135–145)
Total Protein: 7.5 g/dL (ref 6.0–8.3)

## 2017-04-23 LAB — LIPID PANEL
CHOL/HDL RATIO: 3
Cholesterol: 183 mg/dL (ref 0–200)
HDL: 57.9 mg/dL (ref >39.00)
LDL Cholesterol: 94 mg/dL (ref 0–99)
NONHDL: 125.02
Triglycerides: 153 mg/dL — ABNORMAL HIGH (ref 0.0–149.0)
VLDL: 30.6 mg/dL (ref 0.0–40.0)

## 2017-04-23 LAB — HEMOGLOBIN A1C: HEMOGLOBIN A1C: 5.8 % (ref 4.6–6.5)

## 2017-04-23 LAB — TSH: TSH: 3.01 u[IU]/mL (ref 0.35–4.50)

## 2017-04-23 NOTE — Progress Notes (Signed)
Subjective:    Patient ID: Joanne Edwards, female    DOB: [birth year], 77 y.o.   MRN: [id number]  DOS:  04/23/2017 Type of visit - description : cpx Interval history: Overall feels well, no major concerns   Review of Systems Had a hip surgery a few months ago, recuperating well, MSK issues have improved.  Other than above, a 14 point review of systems is negative      Past Medical History:  Diagnosis Date  . Anxiety   . Arthritis   . Balance problem    going down steps  . Complication of anesthesia    patient does not want any spinal anesthesia  . Depression    lost a son 2011 (aneurysm)  . GERD (gastroesophageal reflux disease)   . Headache    hx of   . Hyperlipemia   . Peripheral vascular disease ([place])   . Polio    as a child--loss muscle of lower left leg  . PONV (postoperative nausea and vomiting)     Past Surgical History:  Procedure Laterality Date  . BREAST CYST EXCISION Left   . BREAST SURGERY     reduction  . cysts removed from bilateral ankles     . ear pinned back     . EYE SURGERY     "fat removed from under R eye"  . OOPHORECTOMY Bilateral    was removed b/c mother hx of ovarian cancer  . REDUCTION MAMMAPLASTY    . REPLACEMENT TOTAL KNEE  2006   right knee  . TONSILLECTOMY    . TOTAL HIP ARTHROPLASTY Right 09/27/2016   Procedure: RIGHT TOTAL HIP ARTHROPLASTY ANTERIOR APPROACH;  Surgeon: Mcarthur Rossetti, MD;  Location: [facility];  Service: Orthopedics;  Laterality: Right;  . tummy tuck  1997    Social History   Socioeconomic History  . Marital status: Married    Spouse name: Not on file  . Number of children: 2  . Years of education: Not on file  . Highest education level: Not on file  Social Needs  . Financial resource strain: Not on file  . Food insecurity - worry: Not on file  . Food insecurity - inability: Not on file  . Transportation needs - medical: Not on file  . Transportation needs - non-medical: Not on file  Occupational  History  . Occupation: Retired Therapist, sports  Tobacco Use  . Smoking status: Never Smoker  . Smokeless tobacco: Never Used  Substance and Sexual Activity  . Alcohol use: No    Alcohol/week: 0.0 oz  . Drug use: No  . Sexual activity: Yes  Other Topics Concern  . Not on file  Social History Narrative   Household- pt and husband (71 years)   Lost 1 of her 2 sons (2011)     Family History  Problem Relation Age of Onset  . Ovarian cancer Mother   . CAD Father        h/o angina dx age 37s  . Aneurysm Son   . Colon cancer Neg Hx   . Esophageal cancer Neg Hx   . Rectal cancer Neg Hx   . Stomach cancer Neg Hx   . Heart attack Neg Hx   . Breast cancer Neg Hx      Allergies as of 04/23/2017   No Known Allergies     Medication List        Accurate as of 04/23/17 11:59 PM. Always use your most recent med list.  CALCIUM + D PO Take 1 tablet by mouth 2 (two) times daily.   clonazePAM 0.5 MG tablet Commonly known as:  KLONOPIN Take 1 tablet (0.5 mg total) by mouth as needed.   ibuprofen 200 MG tablet Commonly known as:  ADVIL,MOTRIN Take 400 mg by mouth at bedtime as needed.   multivitamin with minerals Tabs tablet Take 1 tablet by mouth daily.   omeprazole 20 MG capsule Commonly known as:  PRILOSEC Take 1 capsule (20 mg total) by mouth 2 (two) times daily before a meal.   rosuvastatin 20 MG tablet Commonly known as:  CRESTOR Take 1 tablet (20 mg total) at bedtime by mouth.   sertraline 100 MG tablet Commonly known as:  ZOLOFT Take 100 mg by mouth daily.   Vitamin D3 1000 units Caps Take 1,000 Units by mouth daily.          Objective:   Physical Exam BP 126/80 (BP Location: Left Arm, Patient Position: Sitting, Cuff Size: Normal)   Pulse 68   Temp 97.6 F (36.4 C) (Oral)   Resp 14   Ht 5\' 4"  (1.626 m)   Wt 186 lb 8 oz (84.6 kg)   SpO2 99%   BMI 32.01 kg/m  General:   Well developed, well nourished . NAD.  Neck: No  thyromegaly  HEENT:    Normocephalic . Face symmetric, atraumatic Lungs:  CTA B Normal respiratory effort, no intercostal retractions, no accessory muscle use. Heart: RRR,  no murmur.  No pretibial edema bilaterally  Abdomen:  Not distended, soft, non-tender. No rebound or rigidity.   Skin: Exposed areas without rash. Not pale. Not jaundice Neurologic:  alert & oriented X3.  Speech normal, gait assisted, very mild limp noted Strength symmetric and appropriate for age.  Psych: Cognition and judgment appear intact.  Cooperative with normal attention span and concentration.  Behavior appropriate. No anxious or depressed appearing.     Assessment & Plan:    Assessment  Hyperlipidemia -- changed pravachol to crestor 02-2016  Depression, lost a son 2011 (dt aneurysm) Migraines (sees Dr Tomi Likens ) GERD MSK: -DJD -Polio, decreased muscle mass left leg, leg discrepancy -Gait imbalance Varicose veins +FH Ovarian ca, status post bilateral oophorectomy Thyroid cyst, complex per ----> [place] 02-2015: + complex cysts, no criteria for bx ; [place] 03-2016: no criteria for bx  (sse: care everywhere)  PLAN Hyperlipidemia: On Crestor, checking labs Depression: On Zoloft, doing well, hardly ever has used clonazepam. DJD, MSK issues: Fortunately doing well, s/p THR,takes 2 ibuprofen at night with good control of sxs, okay to continue that practice as long as she has no GI s/e.  Precautions discussed RTC 1 year

## 2017-04-23 NOTE — Patient Instructions (Signed)
GO TO THE LAB : Get the blood work     GO TO THE FRONT DESK Schedule your next appointment for a physical  exam in 1 year  Come back sooner if needed  Consider have a Medicare wellness visit with one  of our nurses

## 2017-04-23 NOTE — Assessment & Plan Note (Signed)
--  Tdap: 01-2016;  PNA: 01/06/14 (13), 12/08/07 (23) ; Shingles: 12/08/07; shingrix discussed, had a flu shot   - Female care : no further PAPs ; S/p oophorectomy B -Breast  cancer screening:   MMG 08/2016 -Bone Density-- 04/21/14 at [facility]- normal. On calcium, vitamin D. -CS-- 01/04/13 ,Deatra Ina, MD- diverticulosis, internal hemorrhoids,  f/u 10 years - diet and exercise d/w pt  - Labs: CMP, FLP, CBC, TSH, mild hyperglycemia noted, check A1c.

## 2017-04-23 NOTE — Progress Notes (Signed)
Pre visit review using our clinic review tool, if applicable. No additional management support is needed unless otherwise documented below in the visit note. 

## 2017-04-24 NOTE — Assessment & Plan Note (Signed)
Hyperlipidemia: On Crestor, checking labs Depression: On Zoloft, doing well, hardly ever has used clonazepam. DJD, MSK issues: Fortunately doing well, s/p THR,takes 2 ibuprofen at night with good control of sxs, okay to continue that practice as long as she has no GI s/e.  Precautions discussed RTC 1 year

## 2017-06-04 ENCOUNTER — Encounter: Payer: Self-pay | Admitting: Internal Medicine

## 2017-06-11 ENCOUNTER — Other Ambulatory Visit: Payer: Self-pay | Admitting: Internal Medicine

## 2017-06-17 ENCOUNTER — Encounter (INDEPENDENT_AMBULATORY_CARE_PROVIDER_SITE_OTHER): Payer: Self-pay | Admitting: Orthopaedic Surgery

## 2017-06-17 ENCOUNTER — Ambulatory Visit (INDEPENDENT_AMBULATORY_CARE_PROVIDER_SITE_OTHER): Payer: Medicare HMO

## 2017-06-17 ENCOUNTER — Ambulatory Visit (INDEPENDENT_AMBULATORY_CARE_PROVIDER_SITE_OTHER): Payer: Medicare HMO | Admitting: Orthopaedic Surgery

## 2017-06-17 DIAGNOSIS — Z96641 Presence of right artificial hip joint: Secondary | ICD-10-CM

## 2017-06-17 NOTE — Progress Notes (Signed)
Patient is a very pleasant 77 year old female who is 9 months status post a right total hip arthroplasty.  She states she is doing great and has no pain at all.  She only has slight numbness she states that her anterior thigh but otherwise she does not walk with any type of walker or cane.  She does not have a limp.  She says she is pain-free.  On exam I can easily put her right upper hip through full internal and external rotation Difficulty.  X-rays of her right hip including AP and lateral show well-seated implants with nice thick cortical bone.  In place with the positioning of the implant itself.  There is no evidence of hardware failure or loosening.  This point is for libido she will follow-up as needed.  All questions and concerns were answered and addressed.  She understands that if she gets any type of discomfort with that hip that we will need her to come in for a new x-ray.

## 2017-08-11 ENCOUNTER — Other Ambulatory Visit: Payer: Self-pay | Admitting: Internal Medicine

## 2017-08-11 DIAGNOSIS — Z1231 Encounter for screening mammogram for malignant neoplasm of breast: Secondary | ICD-10-CM

## 2017-08-19 ENCOUNTER — Encounter (HOSPITAL_BASED_OUTPATIENT_CLINIC_OR_DEPARTMENT_OTHER): Payer: Self-pay

## 2017-08-19 ENCOUNTER — Ambulatory Visit (HOSPITAL_BASED_OUTPATIENT_CLINIC_OR_DEPARTMENT_OTHER)
Admission: RE | Admit: 2017-08-19 | Discharge: 2017-08-19 | Disposition: A | Discharge status: Home / Self Care | Payer: Medicare HMO | Source: Ambulatory Visit | Attending: Internal Medicine | Admitting: Internal Medicine

## 2017-08-19 DIAGNOSIS — Z1231 Encounter for screening mammogram for malignant neoplasm of breast: Secondary | ICD-10-CM | POA: Insufficient documentation

## 2017-11-17 ENCOUNTER — Encounter: Payer: Self-pay | Admitting: Internal Medicine

## 2017-11-17 DIAGNOSIS — H25013 Cortical age-related cataract, bilateral: Secondary | ICD-10-CM | POA: Diagnosis not present

## 2017-11-17 DIAGNOSIS — H2513 Age-related nuclear cataract, bilateral: Secondary | ICD-10-CM | POA: Diagnosis not present

## 2017-11-17 DIAGNOSIS — H04123 Dry eye syndrome of bilateral lacrimal glands: Secondary | ICD-10-CM | POA: Diagnosis not present

## 2017-11-17 LAB — HM DIABETES EYE EXAM

## 2017-12-05 ENCOUNTER — Encounter: Payer: Self-pay | Admitting: Internal Medicine

## 2017-12-05 MED ORDER — SERTRALINE HCL 100 MG PO TABS: 100.0000 mg | ORAL_TABLET | Freq: Every day | ORAL | 0 refills | Status: DC

## 2017-12-16 ENCOUNTER — Ambulatory Visit (INDEPENDENT_AMBULATORY_CARE_PROVIDER_SITE_OTHER): Payer: Medicare HMO | Admitting: Internal Medicine

## 2017-12-16 ENCOUNTER — Encounter: Payer: Self-pay | Admitting: Internal Medicine

## 2017-12-16 VITALS — BP 126/74 | HR 74 | Temp 98.0°F | Resp 16 | Ht 64.0 in | Wt 188.1 lb

## 2017-12-16 DIAGNOSIS — F418 Other specified anxiety disorders: Secondary | ICD-10-CM | POA: Diagnosis not present

## 2017-12-16 MED ORDER — OMEPRAZOLE 20 MG PO CPDR: 20.0000 mg | DELAYED_RELEASE_CAPSULE | Freq: Two times a day (BID) | ORAL | 3 refills | Status: AC

## 2017-12-16 MED ORDER — SERTRALINE HCL 100 MG PO TABS: 100.0000 mg | ORAL_TABLET | Freq: Every day | ORAL | 1 refills | Status: DC

## 2017-12-16 MED ORDER — CLONAZEPAM 0.5 MG PO TABS: 0.5000 mg | ORAL_TABLET | ORAL | 1 refills | Status: DC | PRN

## 2017-12-16 NOTE — Progress Notes (Signed)
Pre visit review using our clinic review tool, if applicable. No additional management support is needed unless otherwise documented below in the visit note. 

## 2017-12-16 NOTE — Patient Instructions (Signed)
  GO TO THE FRONT DESK Schedule your next appointment for a  Check up in 4 months   
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
## 2017-12-21 ENCOUNTER — Encounter: Payer: Self-pay | Admitting: Internal Medicine

## 2017-12-26 NOTE — Telephone Encounter (Signed)
Pt calling about these medications again.  States she doesn't need refills, she needs new scripts.  Pt wants to speak with CMA.  Pt can be reached at [phone]

## 2017-12-29 ENCOUNTER — Telehealth: Payer: Self-pay

## 2017-12-29 NOTE — Telephone Encounter (Signed)
Author phoned pt. to discuss pt's medication concerns. No answer; author left detailed VM, apologizing for delay in response, and asked for return phone call [phone].  Copied from [place] [id number]. Topic: General - Other >> Dec 26, 2017  5:19 PM Mcneil, Ja-Kwan wrote: Reason for CRM: Pt called back very upset that no one returned her call before 5 pm as promised. Pt requests a call back regarding her prescription and the refills. >> Dec 29, 2017  7:39 AM Damita Dunnings, CMA wrote: I left the office on Friday at 4:30pm.

## 2018-01-19 ENCOUNTER — Encounter: Payer: Self-pay | Admitting: Internal Medicine

## 2018-01-19 ENCOUNTER — Ambulatory Visit (INDEPENDENT_AMBULATORY_CARE_PROVIDER_SITE_OTHER): Payer: Medicare HMO | Admitting: Internal Medicine

## 2018-01-19 VITALS — BP 124/70 | HR 83 | Temp 98.1°F | Resp 16 | Ht 64.0 in | Wt 188.1 lb

## 2018-01-19 DIAGNOSIS — Z23 Encounter for immunization: Secondary | ICD-10-CM | POA: Diagnosis not present

## 2018-01-19 DIAGNOSIS — F418 Other specified anxiety disorders: Secondary | ICD-10-CM | POA: Diagnosis not present

## 2018-01-19 DIAGNOSIS — Z79899 Other long term (current) drug therapy: Secondary | ICD-10-CM

## 2018-01-19 MED ORDER — CLONAZEPAM 0.5 MG PO TABS: 0.5000 mg | ORAL_TABLET | ORAL | 0 refills | Status: DC | PRN

## 2018-01-19 NOTE — Assessment & Plan Note (Signed)
Anxiety, depression: See last visit, since then she had couples and personal counseling. She feels better. Good compliance with medications >> clonazepam, Zoloft. We will continue with present care, get a UDS and contract.   Flu shot today. Next visit CPX 04/2018
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
## 2018-01-19 NOTE — Patient Instructions (Signed)
GO TO THE LAB : provide a urine sample   GO TO THE FRONT DESK Schedule your next appointment for a physical exam by 1-20 20

## 2018-01-19 NOTE — Progress Notes (Signed)
Pre visit review using our clinic review tool, if applicable. No additional management support is needed unless otherwise documented below in the visit note. 

## 2018-01-20 LAB — PAIN MGMT, PROFILE 8 W/CONF, U
6 Acetylmorphine: NEGATIVE ng/mL (ref <10)
ALCOHOL METABOLITES: NEGATIVE ng/mL (ref <500)
Amphetamines: NEGATIVE ng/mL (ref <500)
BENZODIAZEPINES: NEGATIVE ng/mL (ref <100)
Buprenorphine, Urine: NEGATIVE ng/mL (ref <5)
COCAINE METABOLITE: NEGATIVE ng/mL (ref <150)
CREATININE: 55.3 mg/dL
MARIJUANA METABOLITE: NEGATIVE ng/mL (ref <20)
MDMA: NEGATIVE ng/mL (ref <500)
Opiates: NEGATIVE ng/mL (ref <100)
Oxidant: NEGATIVE ug/mL (ref <200)
Oxycodone: NEGATIVE ng/mL (ref <100)
pH: 6.51 (ref 4.5–9.0)

## 2018-01-30 ENCOUNTER — Telehealth: Payer: Self-pay | Admitting: Internal Medicine

## 2018-01-30 NOTE — Telephone Encounter (Signed)
Copied from [place] [id number]. Topic: Quick Communication - Rx Refill/Question >> Jan 30, 2018 12:39 PM Burchel, Abbi R wrote: Medication: clonazePAM (KLONOPIN) 0.5 MG tablet  [facility] called to Let Dr Larose Kells know that they a re unable to fill this rx as written.  They need more specific directions as to how pt is to take medication.   [place], [address] [place] [place] [address] Phone: [phone] Fax: [phone]

## 2018-02-02 NOTE — Telephone Encounter (Signed)
Fax was sent back informing how to take medication.

## 2018-03-25 ENCOUNTER — Encounter: Payer: Self-pay | Admitting: Internal Medicine

## 2018-03-25 MED ORDER — ROSUVASTATIN CALCIUM 20 MG PO TABS: 20.0000 mg | ORAL_TABLET | Freq: Every day | ORAL | 1 refills | Status: DC

## 2018-04-24 ENCOUNTER — Other Ambulatory Visit: Payer: Self-pay | Admitting: General Practice

## 2018-04-24 ENCOUNTER — Encounter: Payer: Self-pay | Admitting: Internal Medicine

## 2018-04-24 ENCOUNTER — Other Ambulatory Visit: Payer: Self-pay

## 2018-04-24 ENCOUNTER — Ambulatory Visit (INDEPENDENT_AMBULATORY_CARE_PROVIDER_SITE_OTHER): Payer: Medicare HMO | Admitting: Internal Medicine

## 2018-04-24 VITALS — BP 122/76 | HR 76 | Temp 98.1°F | Resp 16 | Ht 64.0 in | Wt 192.0 lb

## 2018-04-24 DIAGNOSIS — Z0000 Encounter for general adult medical examination without abnormal findings: Secondary | ICD-10-CM

## 2018-04-24 LAB — LIPID PANEL
Cholesterol: 176 mg/dL (ref 0–200)
HDL: 60.3 mg/dL (ref >39.00)
LDL Cholesterol: 93 mg/dL (ref 0–99)
NonHDL: 115.86
TRIGLYCERIDES: 114 mg/dL (ref 0.0–149.0)
Total CHOL/HDL Ratio: 3
VLDL: 22.8 mg/dL (ref 0.0–40.0)

## 2018-04-24 LAB — COMPREHENSIVE METABOLIC PANEL
ALBUMIN: 4.3 g/dL (ref 3.5–5.2)
ALK PHOS: 57 U/L (ref 39–117)
ALT: 16 U/L (ref 0–35)
AST: 17 U/L (ref 0–37)
BILIRUBIN TOTAL: 0.4 mg/dL (ref 0.2–1.2)
BUN: 22 mg/dL (ref 6–23)
CO2: 30 mEq/L (ref 19–32)
Calcium: 10.1 mg/dL (ref 8.4–10.5)
Chloride: 103 mEq/L (ref 96–112)
Creatinine, Ser: 0.81 mg/dL (ref 0.40–1.20)
GFR: 68.87 mL/min (ref >60.00)
Glucose, Bld: 86 mg/dL (ref 70–99)
Potassium: 4.4 mEq/L (ref 3.5–5.1)
Sodium: 140 mEq/L (ref 135–145)
TOTAL PROTEIN: 6.9 g/dL (ref 6.0–8.3)

## 2018-04-24 LAB — TSH: TSH: 2.41 u[IU]/mL (ref 0.35–4.50)

## 2018-04-24 MED ORDER — ZOSTER VAC RECOMB ADJUVANTED 50 MCG/0.5ML IM SUSR: 0.5000 mL | Freq: Once | INTRAMUSCULAR | 1 refills | Status: AC

## 2018-04-24 MED ORDER — SERTRALINE HCL 100 MG PO TABS: 150.0000 mg | ORAL_TABLET | Freq: Every day | ORAL | 1 refills | Status: AC

## 2018-04-24 NOTE — Patient Instructions (Signed)
GO TO THE LAB : Get the blood work     GO TO THE FRONT DESK Schedule your next appointment for a checkup in 3 or 4 months   Start Flonase OTC: 2 sprays on each side of the nose daily

## 2018-04-24 NOTE — Assessment & Plan Note (Addendum)
--  Tdap: 01-2016;  PNA: 01/06/14 (13), 12/08/07 (23) ; Shingles: 12/08/07; shingrix discussed , rx printed , had a flu shot   - Female care : no further PAPs ; S/p oophorectomy B -Breast  cancer screening:   MMG 08/2017 per [facility] -Bone Density-- 04/21/14 at [facility]- normal. On calcium, vitamin D. Next dexa ~2021 -CS-- 01/04/13 ,Joanne Ina, MD- diverticulosis, internal hemorrhoids,  f/u 10 years - diet mostly healthy;exercise: doing ok, stationary bike  - Labs: CMP, FLP, CBC, TSH
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
## 2018-04-25 NOTE — Assessment & Plan Note (Signed)
Hyperlipidemia: Continue Crestor, checking labs Depression: Mostly triggered by the relationship with her husband, she reports the gentleman is unhappy, "a very different man" from when she meet him, sometimes angry. He is also my patient, I will refer him to neurology if he is willing to go. The patient sees a counselor Dr. Sima Matas. We agreed to increase sertraline from 100mg  to 150 mg daily.  Clonazepam (1/2 or even 1/4 tab) works well for her. RF prn Recommend to seek immediate help if she ever feels unsafe. GERD, allergies: See review of systems, congestion is not related to GERD rather allergies.  Start Flonase. RTC 4 months

## 2018-07-06 ENCOUNTER — Telehealth: Payer: Self-pay | Admitting: Internal Medicine

## 2018-07-06 NOTE — Telephone Encounter (Signed)
Pt is requesting refill on alprazolam.   Last OV: 04/24/2018 Last Fill: 01/19/2018 #90 and 0RF UDS: 01/19/2018 Low risk

## 2018-07-07 MED ORDER — CLONAZEPAM 0.5 MG PO TABS
0.5000 mg | ORAL_TABLET | ORAL | 0 refills | Status: AC | PRN
Start: 2018-07-07 — End: ?

## 2018-07-07 NOTE — Telephone Encounter (Signed)
90 tablets sent

## 2018-07-13 ENCOUNTER — Encounter: Payer: Self-pay | Admitting: Internal Medicine

## 2018-07-16 ENCOUNTER — Telehealth: Payer: Self-pay | Admitting: Internal Medicine

## 2018-07-16 NOTE — Telephone Encounter (Signed)
Spoke w/ [employer]- rx supposed to say 1 tab po daily prn.

## 2018-07-16 NOTE — Telephone Encounter (Signed)
Copied from [place] [id number]. Topic: Quick Communication - See Telephone Encounter >> Jul 16, 2018  2:30 PM Blase Mess A wrote: CRM for notification. See Telephone encounter for: 07/16/18. Juliann Pulse calling from [facility] is calling regarding clonazePAM (KLONOPIN) 0.5 MG tablet [[id number]] is missing dosage information. Please advise. [place]- [id number] [employer] [phone]

## 2018-07-23 ENCOUNTER — Encounter: Payer: Self-pay | Admitting: Internal Medicine

## 2018-08-12 ENCOUNTER — Other Ambulatory Visit: Payer: Self-pay

## 2018-08-12 ENCOUNTER — Ambulatory Visit (INDEPENDENT_AMBULATORY_CARE_PROVIDER_SITE_OTHER): Payer: Medicare HMO | Admitting: Internal Medicine

## 2018-08-12 DIAGNOSIS — F418 Other specified anxiety disorders: Secondary | ICD-10-CM | POA: Diagnosis not present

## 2018-08-12 DIAGNOSIS — E785 Hyperlipidemia, unspecified: Secondary | ICD-10-CM
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
## 2018-09-08 IMAGING — CT CT ABD-PELV W/ CM
2 of 5 series · 15 of 46 positions shown, 17 images · IV contrast (APPLIED)
Comparison: None.

CLINICAL DATA: Chronic epigastric abdominal pain, mostly
postprandial in nature. Nausea. Initial encounter.

EXAM:
CT ABDOMEN AND PELVIS WITH CONTRAST
TECHNIQUE: Multidetector CT imaging of the abdomen and pelvis was performed
using the standard protocol following bolus administration of
intravenous contrast.
CONTRAST:  100mL [id number] IOPAMIDOL (HQMC08-NQQ) INJECTION 61%

[Series 2: axial st · axial · 0.83mm/px · z∈[+792,+1167]mm · 12 of 85 slices shown, 14 images]
[im 5/85  soft-tissue]
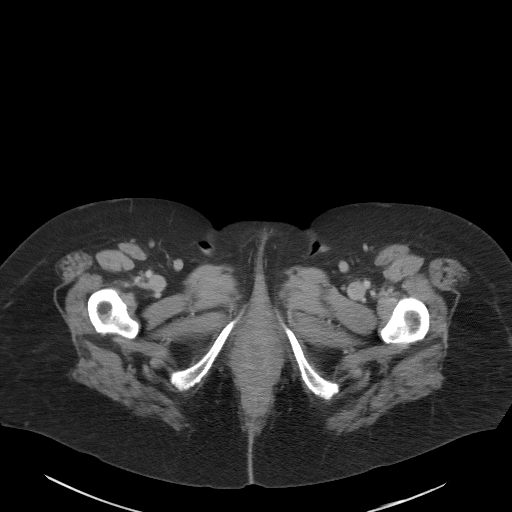
[im 5/85  bone]
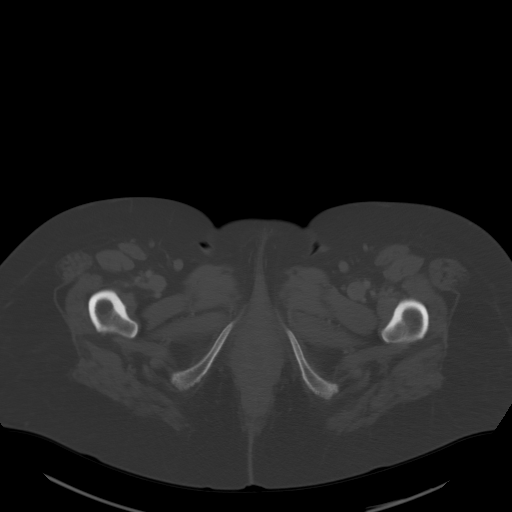
[im 15/85  soft-tissue]
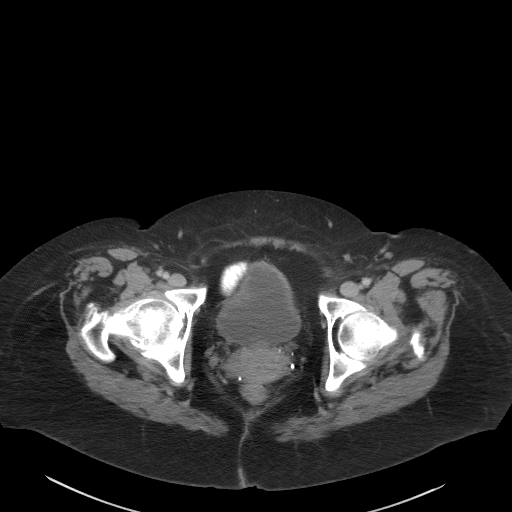
[im 19/85  soft-tissue]
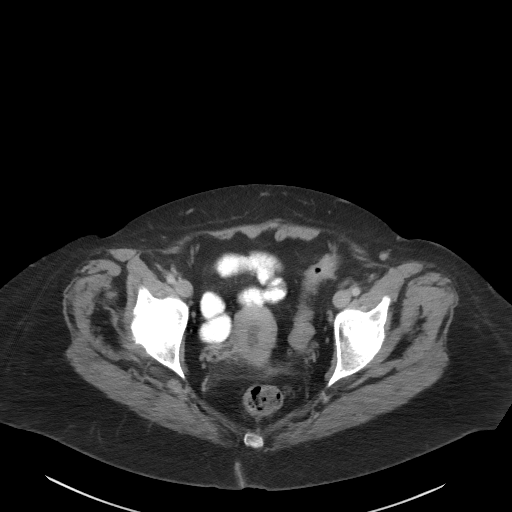
[im 24/85  soft-tissue]
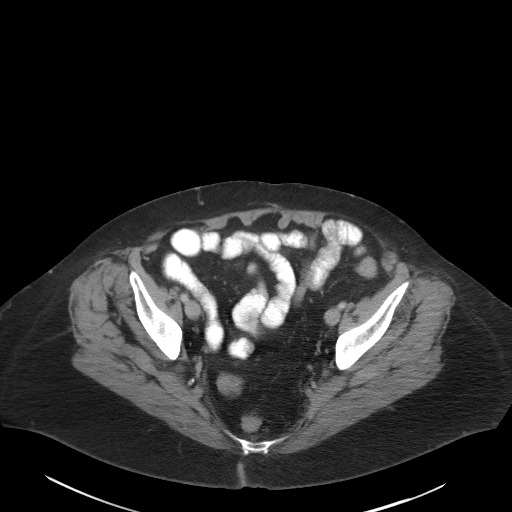
[im 33/85  soft-tissue]
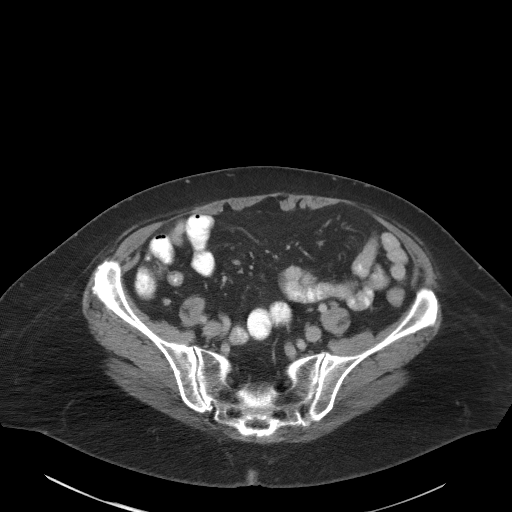
[im 38/85  soft-tissue]
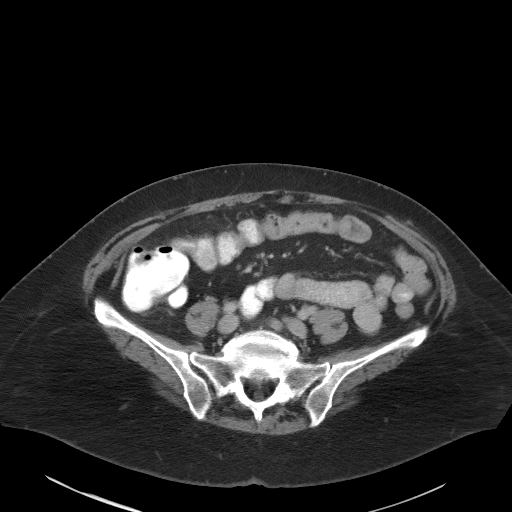
[im 47/85  soft-tissue]
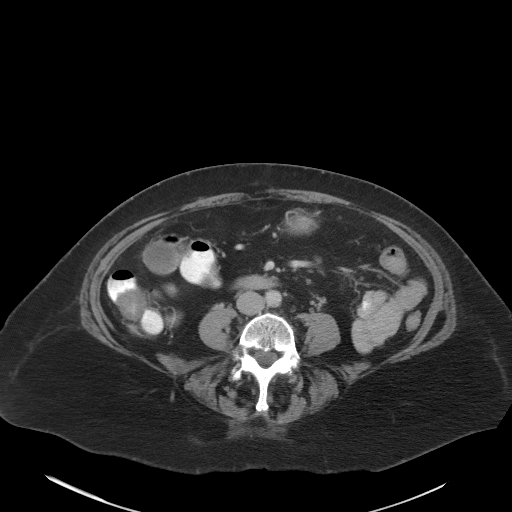
[im 52/85  soft-tissue]
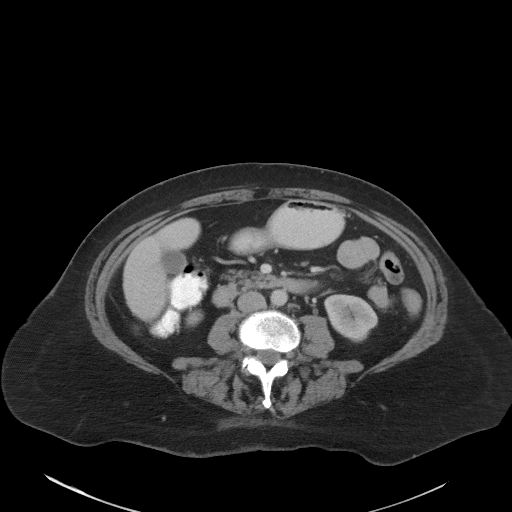
[im 61/85  soft-tissue]
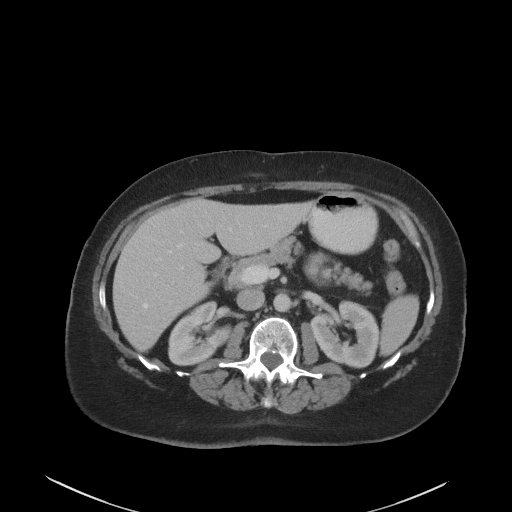
[im 61/85  bone]
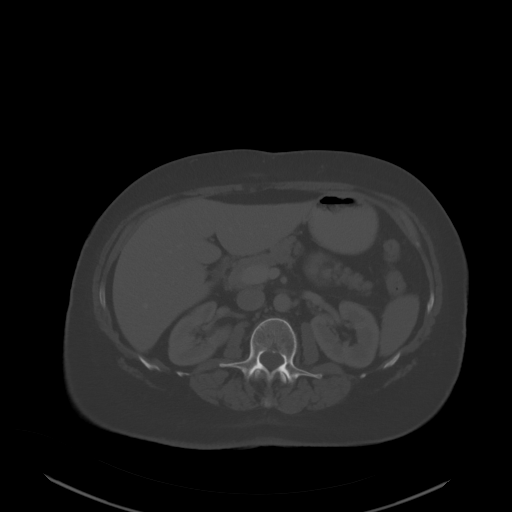
[im 66/85  soft-tissue]
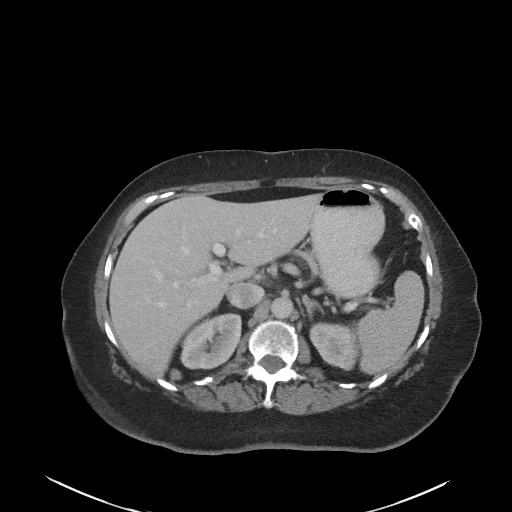
[im 71/85  soft-tissue]
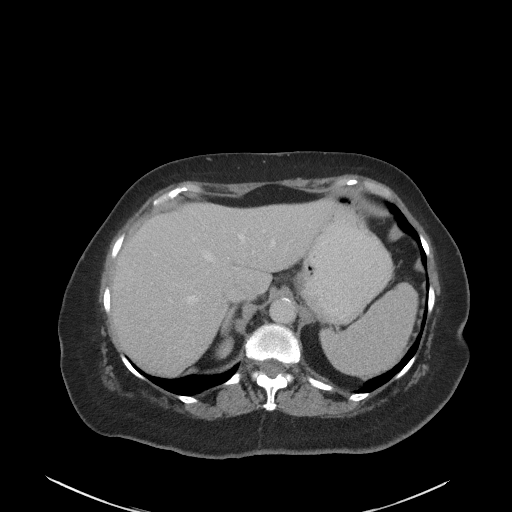
[im 80/85  soft-tissue]
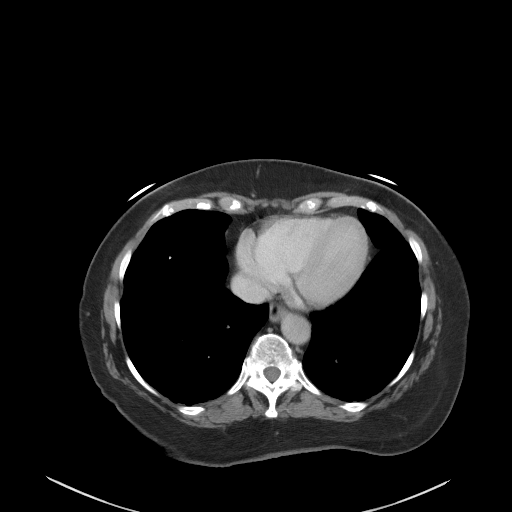

[Series 5: coronal st · coronal · 0.72mm/px · 3 of 75 slices shown]
[im 25/75  soft-tissue]
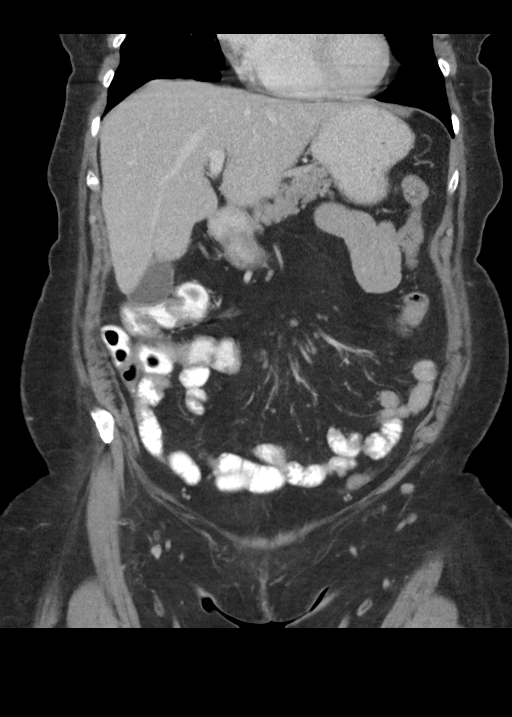
[im 33/75  soft-tissue]
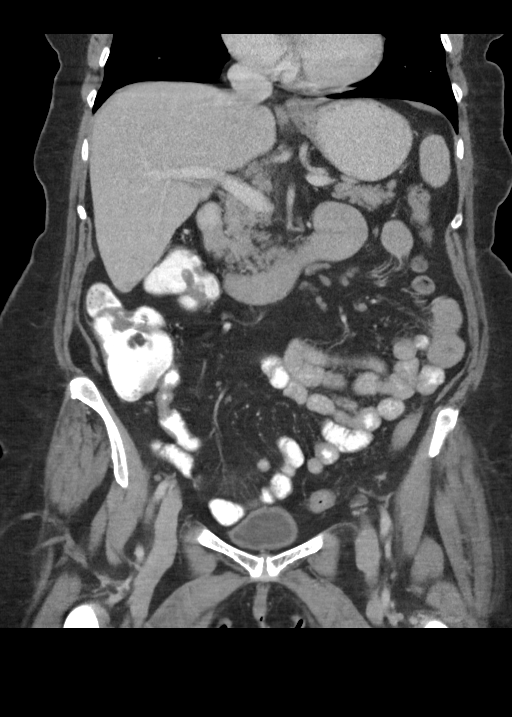
[im 42/75  soft-tissue]
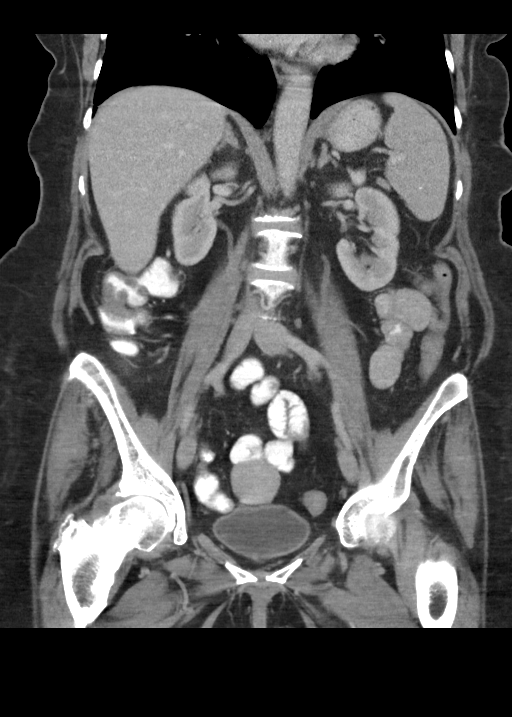

[15 of 46 positions shown; findings below may reference images not displayed]

FINDINGS: Lower chest: The visualized lung bases are grossly clear. The
visualized portions of the mediastinum are unremarkable.

Hepatobiliary: The liver is unremarkable in appearance. The
gallbladder is unremarkable in appearance. The common bile duct
remains normal in caliber.

Pancreas: The pancreas is within normal limits.

Spleen: Scattered calcified granulomata are noted within the spleen.

Adrenals/Urinary Tract: The adrenal glands are unremarkable in
appearance. The kidneys are within normal limits. There is no
evidence of hydronephrosis. No renal or ureteral stones are
identified. No perinephric stranding is seen.

Stomach/Bowel: The stomach is unremarkable in appearance. The small
bowel is within normal limits. The appendix is normal in caliber,
without evidence of appendicitis.

Minimal diverticulosis is noted along the sigmoid colon, without
evidence of diverticulitis.

Vascular/Lymphatic: Minimal calcification is seen along the
abdominal aorta. No retroperitoneal or pelvic sidewall
lymphadenopathy is seen. The inferior vena cava is grossly
unremarkable in appearance.

Reproductive: The bladder is mildly distended and within normal
limits. The uterus is grossly unremarkable in appearance. The
patient is status post bilateral oophorectomy. No suspicious adnexal
masses are seen.

Other: No additional soft tissue abnormalities are seen.

Musculoskeletal: No acute osseous abnormalities are identified.
Degenerative change is noted at the right hip joint, with joint
space narrowing and subcortical cysts. Vacuum phenomenon is noted at
L2-L3. The visualized musculature is unremarkable in appearance.
IMPRESSION: 1. No acute abnormality seen within the abdomen or pelvis.
2. Minimal diverticulosis along the sigmoid colon, without evidence
of diverticulitis.
3. Degenerative change at the right hip joint.

## 2018-09-15 ENCOUNTER — Encounter: Payer: Self-pay | Admitting: Internal Medicine

## 2018-09-15 DIAGNOSIS — Z1231 Encounter for screening mammogram for malignant neoplasm of breast: Secondary | ICD-10-CM

## 2018-09-17 ENCOUNTER — Inpatient Hospital Stay (HOSPITAL_BASED_OUTPATIENT_CLINIC_OR_DEPARTMENT_OTHER): Admission: RE | Admit: 2018-09-17 | Payer: Medicare HMO | Source: Ambulatory Visit

## 2018-09-18 ENCOUNTER — Ambulatory Visit (HOSPITAL_BASED_OUTPATIENT_CLINIC_OR_DEPARTMENT_OTHER)
Admission: RE | Admit: 2018-09-18 | Discharge: 2018-09-18 | Disposition: A | Discharge status: Home / Self Care | Payer: Medicare HMO | Source: Ambulatory Visit | Attending: Internal Medicine | Admitting: Internal Medicine

## 2018-09-18 ENCOUNTER — Other Ambulatory Visit: Payer: Self-pay

## 2018-09-18 DIAGNOSIS — Z1231 Encounter for screening mammogram for malignant neoplasm of breast: Secondary | ICD-10-CM | POA: Diagnosis not present

## 2018-09-23 ENCOUNTER — Ambulatory Visit (HOSPITAL_BASED_OUTPATIENT_CLINIC_OR_DEPARTMENT_OTHER): Payer: Medicare HMO

## 2018-10-11 ENCOUNTER — Other Ambulatory Visit: Payer: Self-pay | Admitting: Internal Medicine

## 2018-10-16 ENCOUNTER — Encounter: Payer: Self-pay | Admitting: Internal Medicine

## 2018-10-16 ENCOUNTER — Other Ambulatory Visit: Payer: Self-pay

## 2018-10-16 ENCOUNTER — Ambulatory Visit (INDEPENDENT_AMBULATORY_CARE_PROVIDER_SITE_OTHER): Payer: Medicare HMO | Admitting: Internal Medicine

## 2018-10-16 ENCOUNTER — Telehealth: Payer: Self-pay

## 2018-10-16 VITALS — BP 139/63 | HR 67 | Temp 98.1°F | Ht 64.0 in

## 2018-10-16 DIAGNOSIS — L089 Local infection of the skin and subcutaneous tissue, unspecified: Secondary | ICD-10-CM

## 2018-10-16 NOTE — Telephone Encounter (Signed)
She has been my patients for a while, we always had a very good relationship, I am sad she ended the physician/ patient relationship this way but her behavior towards my staff is simply not acceptable.

## 2018-10-16 NOTE — Telephone Encounter (Signed)
1055:  Walked pt to room for her 1030 appointment with Dr. Larose Kells.  Notified pt that Dr. Larose Kells was running behind.  Pt verbalized understanding.  11:20  Pt walked out of exam room and stated "I'm leaving!  All I need is an antibiotic for my thumb and you're not taking me serious.  I've been here for over an hour!"  Attempted to get patient to go back into the exam room to carry on with this conversation, she refused.  Spoke to patient in hallway and acknowledged her frustration.  I apologized for her delay and attempted to get her to stay.  At that time, Dr. Larose Kells exited another exam room.  She asked Dr. Larose Kells if he could just write her an antibiotic for her thumb and stated that she was tired of waiting.  Dr. Larose Kells informed her that he could not write an antibiotic without first examining her.  He calmly reemphasized that he is running behind and it could be another 30-40 minutes before he could get to her.  Pt then walked away in direction of the front desk area, stating "I"m leaving!  This is ridicuousStandard Pacific, CMA pulled patient into an empty exam room to discuss other visit options.  Pt was offered a virtual visit in the afternoon.  Pt refused and Princess walked her to the lobby.  Pt then stepped very close to [facility] and began pointing in her face while yelling about her unhappiness with this visit.  Security was called.  In a low, calm voice, Princess asked the patient to stop pointing in her face.  Prior to security's arrival, pt yelled "F*ck you!" and exited the lobby.

## 2018-10-16 NOTE — Progress Notes (Signed)
Pre visit review using our clinic review tool, if applicable. No additional management support is needed unless otherwise documented below in the visit note.   Pt left w/o being seen d/t PCP running behind.

## 2018-10-17 ENCOUNTER — Encounter: Payer: Self-pay | Admitting: Internal Medicine

## 2018-10-18 NOTE — Progress Notes (Signed)
Left without being seen.

## 2018-10-19 ENCOUNTER — Encounter: Payer: Self-pay | Admitting: Internal Medicine

## 2018-10-20 ENCOUNTER — Telehealth: Payer: Self-pay | Admitting: Internal Medicine

## 2018-10-20 NOTE — Telephone Encounter (Signed)
Patient dismissed from [facility] at [facility] by Kathlene November MD, effective 10/16/18. Dismissal Letter sent out by 1st class mail. [employer]

## 2018-11-23 DIAGNOSIS — N950 Postmenopausal bleeding: Secondary | ICD-10-CM | POA: Diagnosis not present

## 2018-11-23 DIAGNOSIS — F4321 Adjustment disorder with depressed mood: Secondary | ICD-10-CM | POA: Diagnosis not present

## 2018-11-23 DIAGNOSIS — K219 Gastro-esophageal reflux disease without esophagitis: Secondary | ICD-10-CM | POA: Diagnosis not present

## 2018-11-23 DIAGNOSIS — E782 Mixed hyperlipidemia: Secondary | ICD-10-CM | POA: Diagnosis not present

## 2018-11-23 DIAGNOSIS — Z96651 Presence of right artificial knee joint: Secondary | ICD-10-CM | POA: Diagnosis not present

## 2018-11-23 DIAGNOSIS — Z96641 Presence of right artificial hip joint: Secondary | ICD-10-CM | POA: Diagnosis not present

## 2018-11-23 DIAGNOSIS — Z8612 Personal history of poliomyelitis: Secondary | ICD-10-CM | POA: Diagnosis not present

## 2018-11-23 DIAGNOSIS — I8393 Asymptomatic varicose veins of bilateral lower extremities: Secondary | ICD-10-CM | POA: Diagnosis not present

## 2018-11-23 DIAGNOSIS — F331 Major depressive disorder, recurrent, moderate: Secondary | ICD-10-CM | POA: Diagnosis not present

## 2018-12-24 ENCOUNTER — Other Ambulatory Visit: Payer: Self-pay | Admitting: Internal Medicine

## 2019-08-02 ENCOUNTER — Other Ambulatory Visit (HOSPITAL_BASED_OUTPATIENT_CLINIC_OR_DEPARTMENT_OTHER): Payer: Self-pay | Admitting: Internal Medicine

## 2019-08-02 DIAGNOSIS — Z1231 Encounter for screening mammogram for malignant neoplasm of breast: Secondary | ICD-10-CM

## 2021-01-17 IMAGING — MG DIGITAL SCREENING BILATERAL MAMMOGRAM WITH TOMO AND CAD
8 series · 8 of 24 positions shown · non-contrast
Comparison: Previous exam(s).

CLINICAL DATA: Screening.

EXAM:
DIGITAL SCREENING BILATERAL MAMMOGRAM WITH TOMO AND CAD

[L MLO synth-2D]
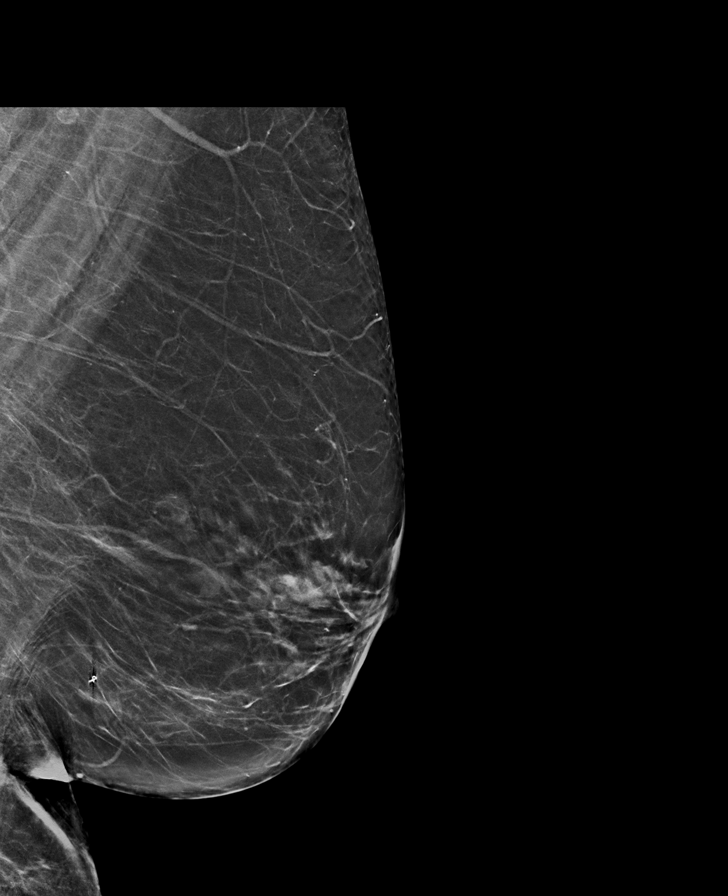

[L CC synth-2D]
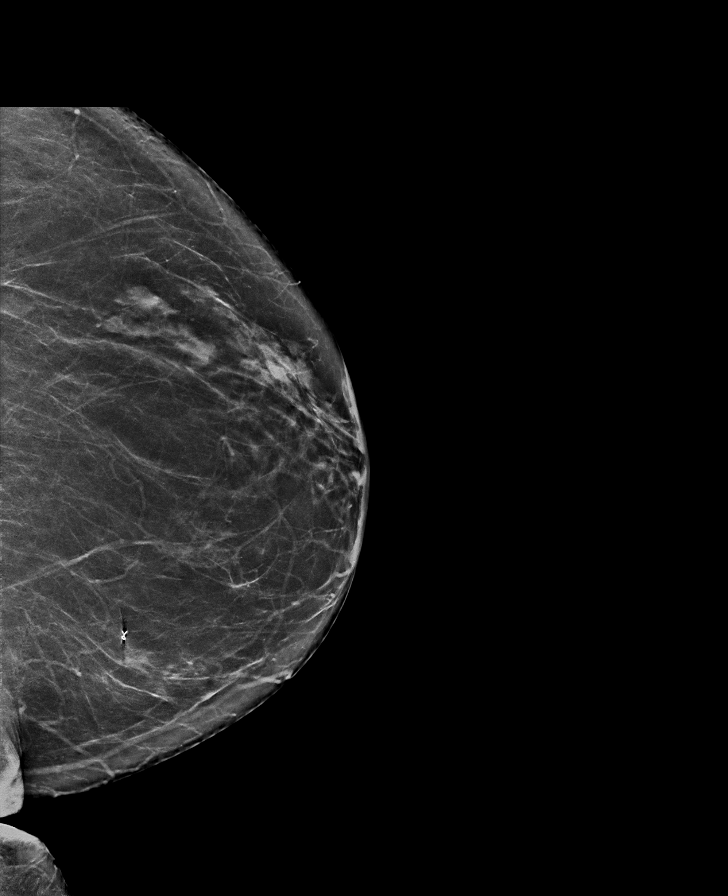

[R MLO synth-2D]
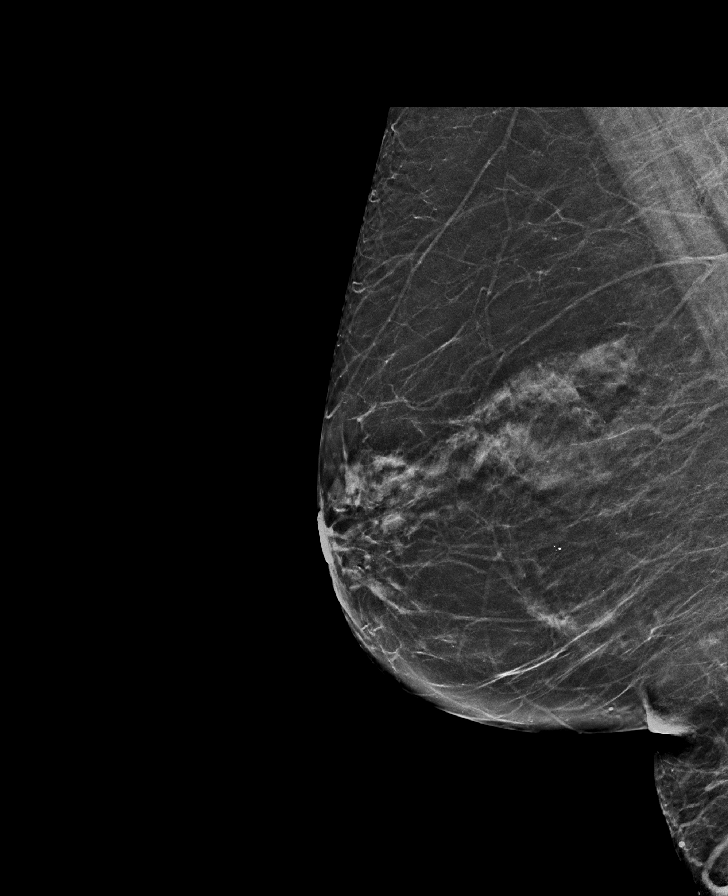

[R CC synth-2D]
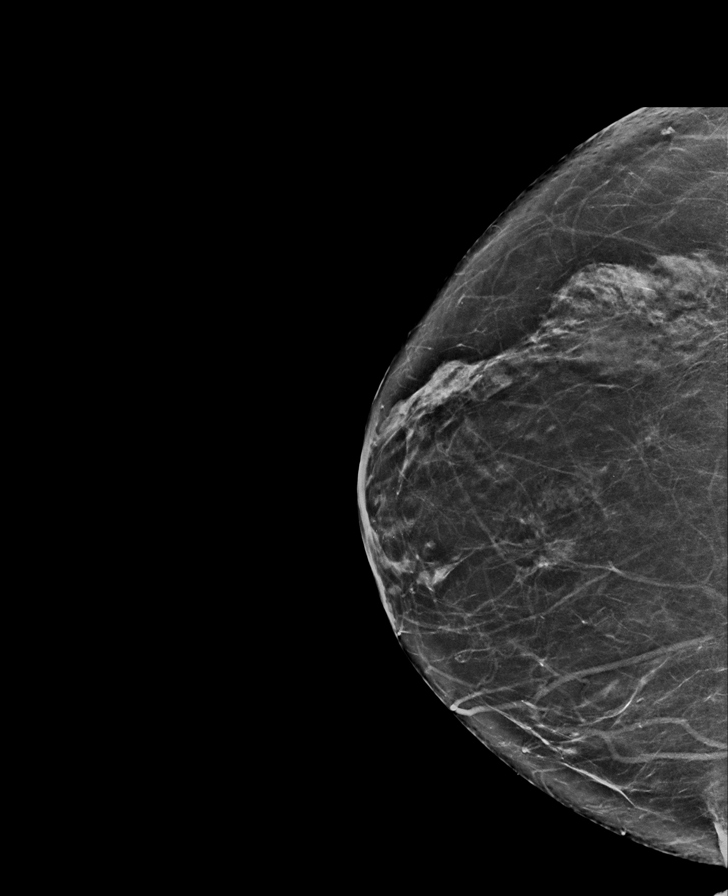

[R CC tomo · tomo slice 33/65.0]
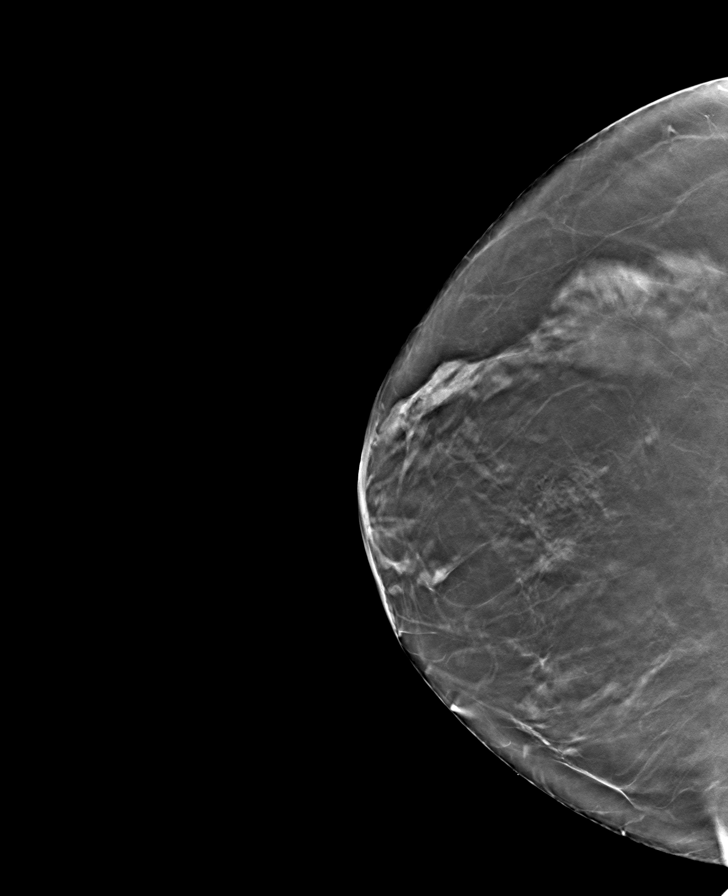

[R MLO tomo · tomo slice 33/65.0]
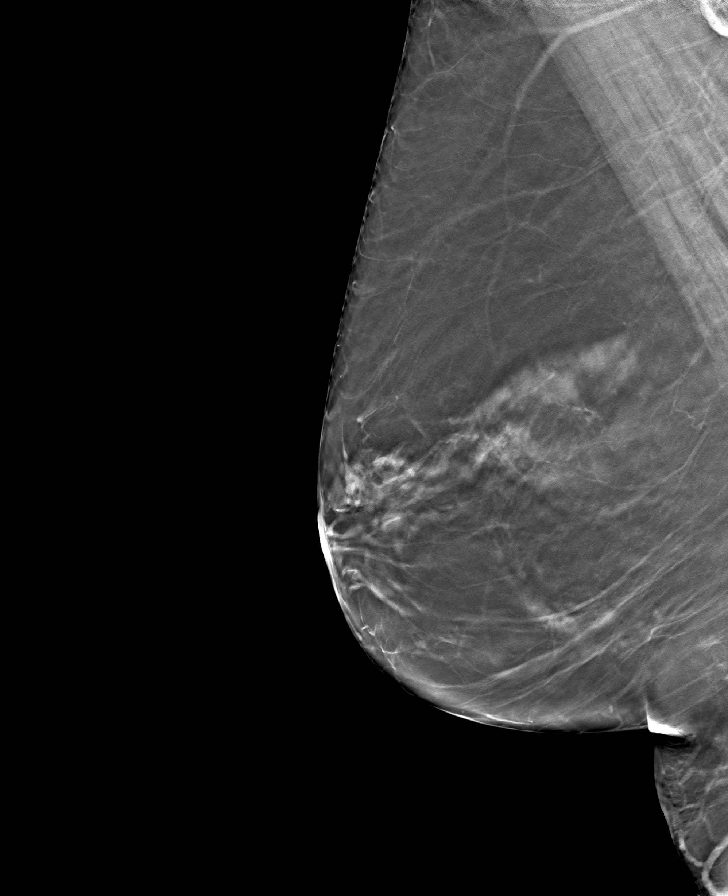

[L CC tomo · tomo slice 33/66.0]
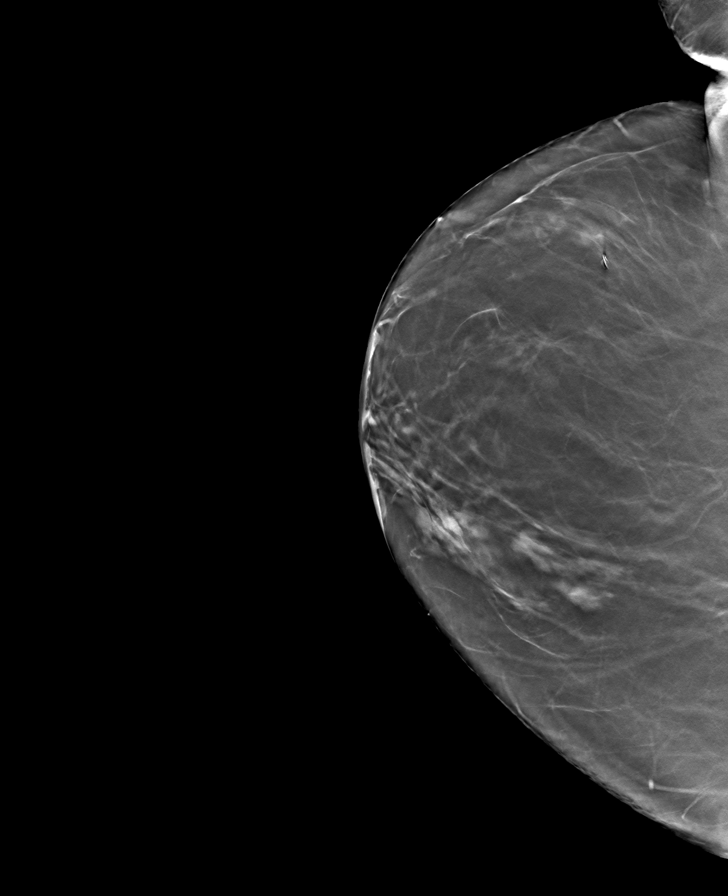

[L MLO tomo · tomo slice 35/69.0]
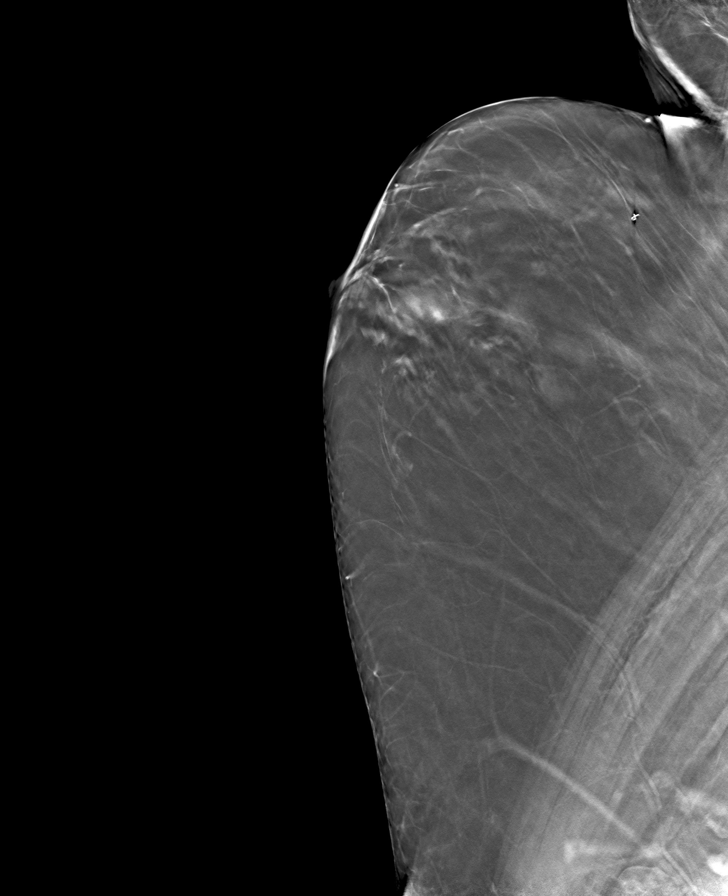

[8 of 24 positions shown; findings below may reference images not displayed]

ACR Breast Density Category b: There are scattered areas of
fibroglandular density.
FINDINGS: There are no findings suspicious for malignancy. Images were
processed with CAD.
IMPRESSION: No mammographic evidence of malignancy. A result letter of this
screening mammogram will be mailed directly to the patient.

RECOMMENDATION:
Screening mammogram in one year. (Code:[id number])

BI-RADS CATEGORY  1: Negative.

## 2023-09-25 ENCOUNTER — Ambulatory Visit: Admission: EM | Admit: 2023-09-25 | Discharge: 2023-09-25 | Disposition: A | Discharge status: Home / Self Care

## 2023-09-25 ENCOUNTER — Other Ambulatory Visit: Payer: Self-pay

## 2023-09-25 DIAGNOSIS — L309 Dermatitis, unspecified: Secondary | ICD-10-CM | POA: Diagnosis not present

## 2023-09-25 DIAGNOSIS — R21 Rash and other nonspecific skin eruption: Secondary | ICD-10-CM | POA: Diagnosis not present

## 2023-09-25 MED ORDER — PREDNISONE 20 MG PO TABS
ORAL_TABLET | ORAL | 0 refills | Status: AC
Start: 2023-09-25 — End: ?

## 2023-09-25 NOTE — ED Provider Notes (Signed)
 [facility]    CSN: [id number] Arrival date & time: 09/25/23  1640      History   Chief Complaint Chief Complaint  Patient presents with   Leg Swelling   Skin Problem    HPI Jo[facility] is a 83 y.o. female.   HPI  Pt reports papular rash that she suspects is from poison ivy  She reports that the rash is very itchy and is causing swelling on her arms and legs She states she was seen via telehealth apt and given topical cream to assist with symptoms but this has not provided any relief  She reports this started after working in her garden despite using long layers and boots for protection She reports she has had a similar reaction in the past that resolved with steroid taper   Past Medical History:  Diagnosis Date   Anxiety    Arthritis    Balance problem    going down steps   Complication of anesthesia    patient does not want any spinal anesthesia   Depression    lost a son 2011 (aneurysm)   GERD (gastroesophageal reflux disease)    Headache    hx of    Hyperlipemia    Peripheral vascular disease (HCC)    Polio    as a child--loss muscle of lower left leg   PONV (postoperative nausea and vomiting)     Patient Active Problem List   Diagnosis Date Noted   DJD (degenerative joint disease) 12/11/2016   Status post total replacement of right hip 09/27/2016   Unilateral primary osteoarthritis, right hip 07/24/2016   Urinary incontinence 02/06/2016   PCP NOTES >>>>>> 02/22/2015   Thyroid  cyst 02/21/2015   Pain and swelling of left lower leg 05/17/2014   Annual physical exam 01/06/2014   Left leg weakness 09/28/2013   GERD (gastroesophageal reflux disease) 07/02/2013   Hyperlipidemia 07/02/2013   Depression with anxiety 07/02/2013   Varicose veins of bilateral lower extremities with other complications 02/18/2013    Past Surgical History:  Procedure Laterality Date   BREAST BIOPSY Left    BREAST CYST EXCISION Left    BREAST SURGERY     reduction    cysts removed from bilateral ankles      ear pinned back      EYE SURGERY     fat removed from under R eye   OOPHORECTOMY Bilateral    was removed b/c mother hx of ovarian cancer   REDUCTION MAMMAPLASTY     REPLACEMENT TOTAL KNEE  2006   right knee   TONSILLECTOMY     TOTAL HIP ARTHROPLASTY Right 09/27/2016   Procedure: RIGHT TOTAL HIP ARTHROPLASTY ANTERIOR APPROACH;  Surgeon: Arnie Lao, MD;  Location: [facility];  Service: Orthopedics;  Laterality: Right;   tummy tuck  1997    OB History   No obstetric history on file.      Home Medications    Prior to Admission medications   Medication Sig Start Date End Date Taking? Authorizing Provider  Azelastine HCl 137 MCG/SPRAY SOLN Place 1 spray into the nose. 09/15/23 10/15/23 Yes [provider]  cyanocobalamin (VITAMIN B12) 1000 MCG tablet Take 1,000 mcg by mouth daily. 12/21/21  Yes [provider]  Omega-3 Fatty Acids (OMEGA-3 FISH OIL) 1200 MG CAPS daily. 08/07/22  Yes [provider]  pravastatin  (PRAVACHOL ) 40 MG tablet Take 40 mg by mouth daily. 10/11/21  Yes [provider]  predniSONE (DELTASONE) 20 MG  tablet Take 60mg  PO daily x 2 days, then40mg  PO daily x 2 days, then 20mg  PO daily x 3 days 09/25/23  Yes Thadeus Gandolfi E, PA-C  Vitamin E 45 MG (100 UNIT) CAPS Take 100 Units by mouth. 06/16/20  Yes [provider]  Calcium  Citrate-Vitamin D  (CALCIUM  + D PO) Take 1 tablet by mouth 2 (two) times daily.    [provider]  Cholecalciferol  (VITAMIN D3) 1000 UNITS CAPS Take 1,000 Units by mouth daily.     [provider]  clonazePAM  (KLONOPIN ) 0.5 MG tablet Take 1 tablet (0.5 mg total) by mouth as needed. 07/07/18   Paz, Jose E, MD  ibuprofen (ADVIL,MOTRIN) 200 MG tablet Take 400 mg by mouth at bedtime as needed.    [provider]  Multiple Vitamin (MULTIVITAMIN WITH MINERALS) TABS tablet Take 1 tablet by mouth daily.    [provider]  omeprazole   (PRILOSEC) 20 MG capsule Take 1 capsule (20 mg total) by mouth 2 (two) times daily before a meal. 12/16/17   Ezell Hollow, MD  rosuvastatin  (CRESTOR ) 20 MG tablet Take 1 tablet (20 mg total) by mouth at bedtime. 10/12/18   Ezell Hollow, MD  sertraline  (ZOLOFT ) 100 MG tablet Take 1.5 tablets (150 mg total) by mouth daily. 04/24/18   Ezell Hollow, MD    Family History Family History  Problem Relation Age of Onset   Ovarian cancer Mother    CAD Father        h/o angina dx age 44s   Aneurysm Son    Colon cancer Neg Hx    Esophageal cancer Neg Hx    Rectal cancer Neg Hx    Stomach cancer Neg Hx    Heart attack Neg Hx    Breast cancer Neg Hx     Social History Social History   Tobacco Use   Smoking status: Never   Smokeless tobacco: Never  Vaping Use   Vaping status: Never Used  Substance Use Topics   Alcohol use: No    Alcohol/week: 0.0 standard drinks of alcohol   Drug use: No     Allergies   Bupropion   Review of Systems Review of Systems  Cardiovascular:  Positive for leg swelling.  Skin:  Positive for rash.     Physical Exam Triage Vital Signs ED Triage Vitals  Encounter Vitals Group     BP 09/25/23 1712 (!) 167/89     Girls Systolic BP Percentile --      Girls Diastolic BP Percentile --      Boys Systolic BP Percentile --      Boys Diastolic BP Percentile --      Pulse Rate 09/25/23 1712 71     Resp 09/25/23 1712 18     Temp 09/25/23 1712 98.1 F (36.7 C)     Temp Source 09/25/23 1712 Oral     SpO2 09/25/23 1712 94 %     Weight 09/25/23 1712 165 lb (74.8 kg)     Height 09/25/23 1712 5' 4 (1.626 m)     Head Circumference --      Peak Flow --      Pain Score 09/25/23 1731 0     Pain Loc --      Pain Education --      Exclude from Growth Chart --    No data found.  Updated Vital Signs BP (!) 167/89 (BP Location: Right Arm) Comment: pt is agitated  Pulse 71  Temp 98.1 F (36.7 C) (Oral)   Resp 18   Ht 5' 4 (1.626 m)   Wt 165 lb (74.8 kg)   SpO2  94%   BMI 28.32 kg/m   Visual Acuity Right Eye Distance:   Left Eye Distance:   Bilateral Distance:    Right Eye Near:   Left Eye Near:    Bilateral Near:     Physical Exam Vitals reviewed.  Constitutional:      General: She is awake.     Appearance: Normal appearance. She is well-developed and well-groomed.  HENT:     Head: Normocephalic and atraumatic.   Eyes:     General: Lids are normal. Gaze aligned appropriately.     Extraocular Movements: Extraocular movements intact.     Conjunctiva/sclera: Conjunctivae normal.   Pulmonary:     Effort: Pulmonary effort is normal.   Musculoskeletal:     Right lower leg: 1+ Edema present.     Left lower leg: 1+ Edema present.   Skin:    General: Skin is warm and dry.     Findings: Rash present. Rash is papular.     Comments: Pt has papular rash/lesions present on forearms. No signs of pustules or vesicles, excoriations or drainage    Neurological:     Mental Status: She is alert and oriented to person, place, and time.   Psychiatric:        Attention and Perception: Attention and perception normal.        Mood and Affect: Mood and affect normal.        Speech: Speech normal.        Behavior: Behavior normal. Behavior is cooperative.      UC Treatments / Results  Labs (all labs ordered are listed, but only abnormal results are displayed) Labs Reviewed - No data to display  EKG   Radiology No results found.  Procedures Procedures (including critical care time)  Medications Ordered in UC Medications - No data to display  Initial Impression / Assessment and Plan / UC Course  I have reviewed the triage vital signs and the nursing notes.  Pertinent labs & imaging results that were available during my care of the patient were reviewed by me and considered in my medical decision making (see chart for details).     *** Final Clinical Impressions(s) / UC Diagnoses   Final diagnoses:  Rash and nonspecific skin  eruption  Dermatitis   Discharge Instructions   None    ED Prescriptions     Medication Sig Dispense Auth. Provider   predniSONE (DELTASONE) 20 MG tablet Take 60mg  PO daily x 2 days, then40mg  PO daily x 2 days, then 20mg  PO daily x 3 days 13 tablet Svea Pusch E, PA-C      PDMP not reviewed this encounter.

## 2023-09-25 NOTE — Discharge Instructions (Addendum)
 You were seen today for a persistent rash on your extremities  This rash appears consistent with contact dermatitis.  This can sometimes occur with you come in contact with poison oak or ivy. To help manage her symptoms I will send in a prednisone taper and I recommend you continue to use topical medications as needed for symptomatic relief (calamine lotion, benadryl  cream)   I have sent in a script for Prednisone taper to be taken in the morning with breakfast per the instructions on the container Remember that steroids can cause sleeplessness, irritability, increased hunger and elevated glucose levels so be mindful of these side effects. They should lessen as you progress to the lower doses of the taper.

## 2023-09-25 NOTE — ED Triage Notes (Signed)
 Pt presents with complaints of skin problem x 2-3 weeks. Pt states she believed she touched poison ivy. Raised, red rash on bilateral arms. Pt is also mentioning worsening swelling in bilateral legs. Currently denies pain. States it is itching and she can feel skin crawling. Prescribed ointment applied to areas with no improvement.
